# Patient Record
Sex: Female | Born: 1965 | Race: White | Hispanic: No | Smoking: Current every day smoker
Health system: Southern US, Community
[De-identification: ages and names within clinical notes are randomized; demographics above are authoritative.]

## PROBLEM LIST (undated history)

## (undated) DIAGNOSIS — E785 Hyperlipidemia, unspecified: Secondary | ICD-10-CM

## (undated) DIAGNOSIS — F17200 Nicotine dependence, unspecified, uncomplicated: Secondary | ICD-10-CM

## (undated) DIAGNOSIS — J449 Chronic obstructive pulmonary disease, unspecified: Secondary | ICD-10-CM

## (undated) HISTORY — DX: Nicotine dependence, unspecified, uncomplicated: F17.200

## (undated) HISTORY — PX: ABDOMINAL HYSTERECTOMY: SHX81

## (undated) HISTORY — DX: Hyperlipidemia, unspecified: E78.5

---

## 2006-12-06 ENCOUNTER — Encounter: Admission: RE | Admit: 2006-12-06 | Discharge: 2006-12-06 | Payer: Self-pay | Admitting: Neurosurgery

## 2010-04-06 LAB — HM PAP SMEAR: HM Pap smear: NORMAL

## 2010-04-13 ENCOUNTER — Encounter: Admission: RE | Admit: 2010-04-13 | Discharge: 2010-04-13 | Payer: Self-pay | Admitting: Family Medicine

## 2010-11-05 ENCOUNTER — Emergency Department (HOSPITAL_COMMUNITY)
Admission: EM | Admit: 2010-11-05 | Discharge: 2010-11-05 | Disposition: A | Discharge status: Home / Self Care | Payer: Medicaid Other | Attending: Emergency Medicine | Admitting: Emergency Medicine

## 2010-11-05 ENCOUNTER — Emergency Department (HOSPITAL_COMMUNITY): Payer: Medicaid Other

## 2010-11-05 DIAGNOSIS — M79609 Pain in unspecified limb: Secondary | ICD-10-CM | POA: Insufficient documentation

## 2010-11-05 DIAGNOSIS — M542 Cervicalgia: Secondary | ICD-10-CM | POA: Insufficient documentation

## 2010-11-05 DIAGNOSIS — R079 Chest pain, unspecified: Secondary | ICD-10-CM | POA: Insufficient documentation

## 2010-11-05 DIAGNOSIS — R1013 Epigastric pain: Secondary | ICD-10-CM | POA: Insufficient documentation

## 2010-11-05 DIAGNOSIS — Z79899 Other long term (current) drug therapy: Secondary | ICD-10-CM | POA: Insufficient documentation

## 2010-11-05 DIAGNOSIS — E7800 Pure hypercholesterolemia, unspecified: Secondary | ICD-10-CM | POA: Insufficient documentation

## 2010-11-05 LAB — BASIC METABOLIC PANEL
CO2: 27 mEq/L (ref 19–32)
Calcium: 9.2 mg/dL (ref 8.4–10.5)
Creatinine, Ser: 0.68 mg/dL (ref 0.4–1.2)
GFR calc Af Amer: >60 mL/min (ref >60)
GFR calc non Af Amer: >60 mL/min (ref >60)

## 2010-11-05 LAB — CBC
Hemoglobin: 14.4 g/dL (ref 12.0–15.0)
MCH: 32.4 pg (ref 26.0–34.0)
Platelets: 252 10*3/uL (ref 150–400)

## 2010-11-05 LAB — DIFFERENTIAL
Basophils Relative: 0 % (ref 0–1)
Eosinophils Absolute: 0.3 10*3/uL (ref 0.0–0.7)
Monocytes Absolute: 0.4 10*3/uL (ref 0.1–1.0)
Neutrophils Relative %: 49 % (ref 43–77)

## 2010-11-05 LAB — POCT CARDIAC MARKERS
CKMB, poc: <1 ng/mL — ABNORMAL LOW (ref 1.0–8.0)
Troponin i, poc: <0.05 ng/mL (ref 0.00–0.09)
Troponin i, poc: <0.05 ng/mL (ref 0.00–0.09)

## 2011-02-03 ENCOUNTER — Encounter: Payer: Self-pay | Admitting: Family Medicine

## 2011-04-25 ENCOUNTER — Other Ambulatory Visit: Payer: Self-pay | Admitting: Family Medicine

## 2011-04-25 DIAGNOSIS — Z1231 Encounter for screening mammogram for malignant neoplasm of breast: Secondary | ICD-10-CM

## 2011-04-29 ENCOUNTER — Ambulatory Visit
Admission: RE | Admit: 2011-04-29 | Discharge: 2011-04-29 | Disposition: A | Discharge status: Home / Self Care | Payer: Medicaid Other | Source: Ambulatory Visit | Attending: Family Medicine | Admitting: Family Medicine

## 2011-04-29 DIAGNOSIS — Z1231 Encounter for screening mammogram for malignant neoplasm of breast: Secondary | ICD-10-CM

## 2012-05-04 ENCOUNTER — Other Ambulatory Visit: Payer: Self-pay | Admitting: Family Medicine

## 2012-05-04 DIAGNOSIS — Z1231 Encounter for screening mammogram for malignant neoplasm of breast: Secondary | ICD-10-CM

## 2012-06-01 ENCOUNTER — Ambulatory Visit
Admission: RE | Admit: 2012-06-01 | Discharge: 2012-06-01 | Disposition: A | Discharge status: Home / Self Care | Payer: Medicaid Other | Source: Ambulatory Visit | Attending: Family Medicine | Admitting: Family Medicine

## 2012-06-01 DIAGNOSIS — Z1231 Encounter for screening mammogram for malignant neoplasm of breast: Secondary | ICD-10-CM

## 2012-11-12 ENCOUNTER — Other Ambulatory Visit (INDEPENDENT_AMBULATORY_CARE_PROVIDER_SITE_OTHER): Payer: Medicaid Other

## 2012-11-12 ENCOUNTER — Telehealth: Payer: Self-pay | Admitting: Family Medicine

## 2012-11-12 DIAGNOSIS — Z79899 Other long term (current) drug therapy: Secondary | ICD-10-CM

## 2012-11-12 DIAGNOSIS — E785 Hyperlipidemia, unspecified: Secondary | ICD-10-CM

## 2012-11-12 LAB — COMPREHENSIVE METABOLIC PANEL
Albumin: 4.3 g/dL (ref 3.5–5.2)
Alkaline Phosphatase: 68 U/L (ref 39–117)
CO2: 30 mEq/L (ref 19–32)
Calcium: 9.7 mg/dL (ref 8.4–10.5)
Chloride: 106 mEq/L (ref 96–112)
Creat: 0.63 mg/dL (ref 0.50–1.10)
Glucose, Bld: 113 mg/dL — ABNORMAL HIGH (ref 70–99)
Sodium: 141 mEq/L (ref 135–145)
Total Protein: 7.1 g/dL (ref 6.0–8.3)

## 2012-11-12 LAB — CBC WITH DIFFERENTIAL/PLATELET
Eosinophils Absolute: 0.4 10*3/uL (ref 0.0–0.7)
Eosinophils Relative: 4 % (ref 0–5)
HCT: 43.2 % (ref 36.0–46.0)
Lymphs Abs: 2.5 10*3/uL (ref 0.7–4.0)
MCH: 32.7 pg (ref 26.0–34.0)
Monocytes Relative: 6 % (ref 3–12)
Neutrophils Relative %: 61 % (ref 43–77)
Platelets: 241 10*3/uL (ref 150–400)
RBC: 4.59 MIL/uL (ref 3.87–5.11)
RDW: 14.1 % (ref 11.5–15.5)
WBC: 8.5 10*3/uL (ref 4.0–10.5)

## 2012-11-12 LAB — LIPID PANEL
Cholesterol: 128 mg/dL (ref 0–200)
LDL Cholesterol: 75 mg/dL (ref 0–99)

## 2012-11-12 MED ORDER — ROSUVASTATIN CALCIUM 20 MG PO TABS: 20.0000 mg | ORAL_TABLET | Freq: Every day | ORAL | Status: DC

## 2012-11-12 NOTE — Telephone Encounter (Signed)
NEED MEDICATION

## 2012-11-12 NOTE — Telephone Encounter (Signed)
RX Refilled 

## 2012-12-10 ENCOUNTER — Encounter: Payer: Self-pay | Admitting: Family Medicine

## 2012-12-10 ENCOUNTER — Ambulatory Visit (INDEPENDENT_AMBULATORY_CARE_PROVIDER_SITE_OTHER): Payer: Medicaid Other | Admitting: Family Medicine

## 2012-12-10 VITALS — BP 100/60 | HR 80 | Temp 98.1°F | Resp 14 | Wt 153.0 lb

## 2012-12-10 DIAGNOSIS — F17200 Nicotine dependence, unspecified, uncomplicated: Secondary | ICD-10-CM

## 2012-12-10 DIAGNOSIS — R079 Chest pain, unspecified: Secondary | ICD-10-CM

## 2012-12-10 DIAGNOSIS — Z0000 Encounter for general adult medical examination without abnormal findings: Secondary | ICD-10-CM

## 2012-12-10 DIAGNOSIS — E785 Hyperlipidemia, unspecified: Secondary | ICD-10-CM

## 2012-12-10 NOTE — Progress Notes (Signed)
Subjective:    Patient ID: Michelle Clayton, female    DOB: 11-28-1965, 47 y.o.   MRN: [id number]  HPI Patient is here for complete physical exam.  She continues to smoke.  Her brother has recently had a massive heart attack at age 59. Furthermore the patient reports that she is having episodic chest pain. This seems to be related to stress and anxiety and has the characteristics of a panic attack. She denies any angina, shortness of breath, or dyspnea on exertion. The pain is substernal and pressure-like in intensity but it does not radiate. Again it occurs when she is anxious.  She has a strong family history of coronary artery disease.  Past Medical History  Diagnosis Date  . Hyperlipidemia   . Current smoker    Current outpatient prescriptions:aspirin 81 MG tablet, Take 81 mg by mouth daily., Disp: , Rfl: ;  niacin (NIASPAN) 500 MG CR tablet, 500 mg. 3 tab po qhs, Disp: , Rfl: ;  rosuvastatin (CRESTOR) 20 MG tablet, Take 1 tablet (20 mg total) by mouth at bedtime., Disp: 30 tablet, Rfl: 3  No Known Allergies   History   Social History  . Marital Status: Legally Separated    Spouse Name: N/A    Number of Children: N/A  . Years of Education: N/A   Occupational History  . Not on file.   Social History Main Topics  . Smoking status: Current Every Day Smoker -- 1.00 packs/day    Types: Cigarettes  . Smokeless tobacco: Not on file  . Alcohol Use: No  . Drug Use: No  . 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 Not on file   Social History Narrative  . No narrative on file   Family History  Problem Relation Age of Onset  . COPD Mother   . Cancer Father     lung, liver, brain primary unknown  . Heart disease Brother 7    MI  . Cancer Paternal Grandfather     colon     Review of Systems  All other systems reviewed and are negative.       Objective:   Physical Exam  Constitutional:  She is oriented to person, place, and time. She appears well-developed and well-nourished.  HENT:  Head: Normocephalic.  Right Ear: External ear normal.  Left Ear: External ear normal.  Nose: Nose normal.  Mouth/Throat: Oropharynx is clear and moist. No oropharyngeal exudate.  Eyes: Conjunctivae and EOM are normal. Pupils are equal, round, and reactive to light. Right eye exhibits no discharge. Left eye exhibits no discharge. No scleral icterus.  Neck: Normal range of motion. Neck supple. No JVD present. No tracheal deviation present. No thyromegaly present.  Cardiovascular: Normal rate, regular rhythm and normal heart sounds.  Exam reveals no gallop and no friction rub.   No murmur heard. Pulmonary/Chest: Effort normal and breath sounds normal. No stridor. No respiratory distress. She has no wheezes. She has no rales. She exhibits no tenderness.  Abdominal: Soft. Bowel sounds are normal. She exhibits no distension and no mass. There is no tenderness. There is no rebound and no guarding.  Musculoskeletal: Normal range of motion. She exhibits no edema and no tenderness.  Lymphadenopathy:    She has no cervical adenopathy.  Neurological: She is alert and oriented to person, place, and time. She has normal reflexes. She displays normal reflexes. No  cranial nerve deficit. She exhibits normal muscle tone. Coordination normal.  Skin: Skin is warm and dry. No rash noted. No erythema. No pallor.  Psychiatric: She has a normal mood and affect. Her behavior is normal. Judgment and thought content normal.   patient's breasts are normal.        Assessment & Plan:  1. Routine general medical examination at a health care facility Patient had a mammogram last in October. She's not due until this October. Strongly encourage smoking cessation.    2. Smoker Strongly encourage smoking cessation - DG Chest 2 View; Future  3. HLD (hyperlipidemia) I reviewed the patient's lab work. It was significant for  a glucose of 113. However her fasting lipid panel was outstanding. We will continue the Crestor. I spent 10 minutes discussing therapeutic lifestyle changes to address her hyperglycemia. These include diet exercise and weight loss.  Recheck labs in 6 months.  4. Chest pain Given her smoking history, I will obtain a chest x-ray to rule out pulmonic pathology. I feel the patient's pain is most likely panic attacks.  However the patient like to schedule a stress test given her strong family history coronary artery disease, smoking, her hyperlipidemia. I feel that this is appropriate and will arrange a nonurgent outpatient stress test. - DG Chest 2 View; Future - Ambulatory referral to Cardiology

## 2012-12-25 ENCOUNTER — Other Ambulatory Visit: Payer: Self-pay | Admitting: Family Medicine

## 2012-12-25 MED ORDER — ROSUVASTATIN CALCIUM 20 MG PO TABS: 20.0000 mg | ORAL_TABLET | Freq: Every day | ORAL | Status: DC

## 2012-12-25 NOTE — Telephone Encounter (Signed)
Med rf °

## 2012-12-26 ENCOUNTER — Encounter: Payer: Self-pay | Admitting: Family Medicine

## 2012-12-26 NOTE — Telephone Encounter (Signed)
Error pleae disregbard

## 2012-12-26 NOTE — Progress Notes (Signed)
Patient ID: Michelle Clayton, female   DOB: June 21, 1966, 47 y.o.   MRN: [id number] Pt states she called pharmacy to see if her crestor was ready and they told her they were waiting on our approval. I checked her chart and it shows that we had sent it prescription on the 13 May. Pt is going to call pharmacy again and let them know and if they still dont have it then she will call me back

## 2013-01-04 ENCOUNTER — Encounter: Payer: Self-pay | Admitting: Cardiovascular Disease

## 2013-01-04 ENCOUNTER — Ambulatory Visit (INDEPENDENT_AMBULATORY_CARE_PROVIDER_SITE_OTHER): Payer: Medicaid Other | Admitting: Cardiovascular Disease

## 2013-01-04 ENCOUNTER — Ambulatory Visit (INDEPENDENT_AMBULATORY_CARE_PROVIDER_SITE_OTHER)
Admission: RE | Admit: 2013-01-04 | Discharge: 2013-01-04 | Disposition: A | Discharge status: Home / Self Care | Payer: Medicaid Other | Source: Ambulatory Visit | Attending: Cardiovascular Disease | Admitting: Cardiovascular Disease

## 2013-01-04 VITALS — BP 128/70 | HR 73 | Ht 63.0 in | Wt 153.0 lb

## 2013-01-04 DIAGNOSIS — R0989 Other specified symptoms and signs involving the circulatory and respiratory systems: Secondary | ICD-10-CM

## 2013-01-04 DIAGNOSIS — R079 Chest pain, unspecified: Secondary | ICD-10-CM

## 2013-01-04 DIAGNOSIS — R0600 Dyspnea, unspecified: Secondary | ICD-10-CM

## 2013-01-04 DIAGNOSIS — E785 Hyperlipidemia, unspecified: Secondary | ICD-10-CM

## 2013-01-04 DIAGNOSIS — F17200 Nicotine dependence, unspecified, uncomplicated: Secondary | ICD-10-CM

## 2013-01-04 NOTE — Assessment & Plan Note (Signed)
Counseled for less than 10 minutes not motivated to quit

## 2013-01-04 NOTE — Progress Notes (Signed)
Patient ID: Michelle Clayton, female   DOB: 02/02/1966, 47 y.o.   MRN: [id number] Patient is here for evaluation of chest pain  She continues to smoke. Her brother has recently had a massive heart attack at age 31. Furthermore the patient reports that she is having episodic chest pain. This seems to be related to stress and anxiety and has the characteristics of a panic attack. She found out her dad had metastatic cancer at same time  She denies any angina, shortness of breath, or dyspnea on exertion. The pain is substernal and pressure-like in intensity but it does not radiate. Again it occurs when she is anxious. She has a strong family history of coronary artery disease.  She smokes ppd.  Has not had a CXR in long time. Tried Chantix before but couldn't tolertate " made me mean and craszy"   ROS: Denies fever, malais, weight loss, blurry vision, decreased visual acuity, cough, sputum, SOB, hemoptysis, pleuritic pain, palpitaitons, heartburn, abdominal pain, melena, lower extremity edema, claudication, or rash.  All other systems reviewed and negative   General: Affect appropriate Healthy:  appears stated age HEENT: normal Neck supple with no adenopathy JVP normal no bruits no thyromegaly Lungs clear with no wheezing and good diaphragmatic motion Heart:  S1/S2 no murmur,rub, gallop or click PMI normal Abdomen: benighn, BS positve, no tenderness, no AAA no bruit.  No HSM or HJR Distal pulses intact with no bruits No edema Neuro non-focal Skin warm and dry No muscular weakness  Medications Current Outpatient Prescriptions  Medication Sig Dispense Refill  . aspirin 81 MG tablet Take 81 mg by mouth daily.      . niacin (NIASPAN) 500 MG CR tablet 500 mg. 3 tab po qhs      . rosuvastatin (CRESTOR) 20 MG tablet Take 1 tablet (20 mg total) by mouth at bedtime.  30 tablet  3   No current facility-administered medications for this visit.    Allergies Review of patient's allergies indicates  no known allergies.  Family History: Family History  Problem Relation Age of Onset  . COPD Mother   . Cancer Father     lung, liver, brain primary unknown  . Heart disease Brother 71    MI  . Cancer Paternal Grandfather     colon    Social History: History   Social History  . Marital Status: Legally Separated    Spouse Name: N/A    Number of Children: N/A  . Years of Education: N/A   Occupational History  . Not on file.   Social History Main Topics  . Smoking status: Current Every Day Smoker -- 1.00 packs/day    Types: Cigarettes  . Smokeless tobacco: Not on file  . Alcohol Use: No  . Drug Use: No  . 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 Not on file   Social History Narrative  . No narrative on file    Electrocardiogram:  NSR normal ECG rate 73  Assessment and Plan

## 2013-01-04 NOTE — Assessment & Plan Note (Signed)
Atypical Agree likely stress related. Normal ECG  F/U ETT

## 2013-01-04 NOTE — Patient Instructions (Signed)
Your physician recommends that you schedule a follow-up appointment in: AS NEEDED  Your physician recommends that you continue on your current medications as directed. Please refer to the Current Medication list given to you today. A chest x-ray takes a picture of the organs and structures inside the chest, including the heart, lungs, and blood vessels. This test can show several things, including, whether the heart is enlarges; whether fluid is building up in the lungs; and whether pacemaker / defibrillator leads are still in place. Your physician has requested that you have an exercise tolerance test. For further information please visit [email or web address]. Please also follow instruction sheet, as given.  

## 2013-01-04 NOTE — Assessment & Plan Note (Signed)
Cholesterol is at goal.  Continue current dose of statin and diet Rx.  No myalgias or side effects.  F/U  LFT's in 6 months. Lab Results  Component Value Date   LDLCALC 75 11/12/2012

## 2013-01-28 ENCOUNTER — Ambulatory Visit (INDEPENDENT_AMBULATORY_CARE_PROVIDER_SITE_OTHER): Payer: Medicaid Other | Admitting: Nurse Practitioner

## 2013-01-28 ENCOUNTER — Ambulatory Visit (INDEPENDENT_AMBULATORY_CARE_PROVIDER_SITE_OTHER): Payer: Medicaid Other | Admitting: Family Medicine

## 2013-01-28 ENCOUNTER — Encounter: Payer: Self-pay | Admitting: Nurse Practitioner

## 2013-01-28 ENCOUNTER — Encounter: Payer: Self-pay | Admitting: Family Medicine

## 2013-01-28 VITALS — BP 106/68 | HR 82 | Temp 98.7°F | Resp 16 | Wt 149.0 lb

## 2013-01-28 VITALS — BP 109/70 | HR 80

## 2013-01-28 DIAGNOSIS — R079 Chest pain, unspecified: Secondary | ICD-10-CM

## 2013-01-28 DIAGNOSIS — B029 Zoster without complications: Secondary | ICD-10-CM

## 2013-01-28 MED ORDER — VALACYCLOVIR HCL 1 G PO TABS: 1000.0000 mg | ORAL_TABLET | Freq: Three times a day (TID) | ORAL | Status: DC

## 2013-01-28 MED ORDER — HYDROCODONE-ACETAMINOPHEN 10-325 MG PO TABS: 1.0000 | ORAL_TABLET | Freq: Three times a day (TID) | ORAL | Status: DC | PRN

## 2013-01-28 NOTE — Progress Notes (Signed)
  Subjective:    Patient ID: Michelle Clayton, female    DOB: 04-08-1966, 47 y.o.   MRN: [id number]  HPI Patient reports a two-day history of burning stinging pain slightly inferior to her right buttock.  There is a red, papular, vesicular rash in a cluster in that area.  Is herpetiform in nature. The surrounding skin feels like it's on fire. Past Medical History  Diagnosis Date  . Hyperlipidemia   . Current smoker    Current Outpatient Prescriptions on File Prior to Visit  Medication Sig Dispense Refill  . aspirin 81 MG tablet Take 81 mg by mouth daily.      . niacin (NIASPAN) 500 MG CR tablet 500 mg. 3 tab po qhs      . rosuvastatin (CRESTOR) 20 MG tablet Take 1 tablet (20 mg total) by mouth at bedtime.  30 tablet  3   No current facility-administered medications on file prior to visit.   No Known Allergies History   Social History  . Marital Status: Legally Separated    Spouse Name: N/A    Number of Children: N/A  . Years of Education: N/A   Occupational History  . Not on file.   Social History Main Topics  . Smoking status: Current Every Day Smoker -- 1.00 packs/day    Types: Cigarettes  . Smokeless tobacco: Not on file  . Alcohol Use: No  . Drug Use: No  . 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 Not on file   Social History Narrative  . No narrative on file      Review of Systems  All other systems reviewed and are negative.       Objective:   Physical Exam  Vitals reviewed. Cardiovascular: Normal rate and regular rhythm.   Pulmonary/Chest: Effort normal and breath sounds normal.  Skin: Rash noted.   5 cm cluster of erythematous papules and vesicles just inferior to the right gluteus maximus. The herpetiform rash is most consistent with shingles.        Assessment & Plan:  1. Shingles Valtrex 1 g by mouth 3 times a day for 7 days. Norco 10/325 one by mouth every 8  hours when necessary severe pain. I advised the patient to break the pills in half and take half a pill as needed. She was given 20 tablets.

## 2013-01-28 NOTE — Progress Notes (Signed)
Exercise Treadmill Test  Pre-Exercise Testing Evaluation Rhythm: normal sinus  Rate: 80     Test   Exercise Tolerance Test Ordering MD: Charlton Haws, MD  Interpreting MD: Norma Fredrickson, NP  Unique Test No: 1  Treadmill:  1  Indication for ETT: chest pain - rule out ischemia  Contraindication to ETT: No   Stress Modality: exercise - treadmill  Cardiac Imaging Performed: non   Protocol: standard Bruce - maximal  Max BP:  143/81  Max MPHR (bpm):  173 85% MPR (bpm):  147  MPHR obtained (bpm):  148 % MPHR obtained:  86%  Reached 85% MPHR (min:sec):  7:52 Total Exercise Time (min-sec):  9:00  Workload in METS:  9 Borg Scale: 19  Reason ETT Terminated:  patient's desire to stop    ST Segment Analysis At Rest: normal ST segments - no evidence of significant ST depression With Exercise: no evidence of significant ST depression  Other Information Arrhythmia:  No Angina during ETT:  absent (0) Quality of ETT:  diagnostic  ETT Interpretation:  normal - no evidence of ischemia by ST analysis  Comments: Patient presents today for routine GXT. Has had chest pain in the setting of significant situational stress (brother with massive MI and father with metastatic cancer - all found within an hour's time). Has ongoing tobacco abuse and HLD on therapy.   Today, she exercised on the standard Bruce protocol for a total of 9 minutes. Good exercise tolerance. Adequate BP response. Clinically negative for chest pain. EKG negative for ischemia. No arrhythmia noted.   Recommendations: CV risk factor modification.  Smoking cessation  See back prn.   Patient is agreeable to this plan and will call if any problems develop in the interim.   Rosalio Macadamia, RN, ANP-C [facility] [address] [place], [place]  [address]

## 2013-02-28 ENCOUNTER — Telehealth: Payer: Self-pay | Admitting: Family Medicine

## 2013-02-28 MED ORDER — NIACIN ER (ANTIHYPERLIPIDEMIC) 500 MG PO TBCR: 500.0000 mg | EXTENDED_RELEASE_TABLET | Freq: Every day | ORAL | Status: DC

## 2013-02-28 NOTE — Telephone Encounter (Signed)
Med refijlled °

## 2013-03-04 ENCOUNTER — Telehealth: Payer: Self-pay | Admitting: Family Medicine

## 2013-03-04 MED ORDER — NIACIN ER (ANTIHYPERLIPIDEMIC) 500 MG PO TBCR: 500.0000 mg | EXTENDED_RELEASE_TABLET | Freq: Every day | ORAL | Status: DC

## 2013-03-04 NOTE — Telephone Encounter (Signed)
Sent correct rx number of tabs

## 2013-03-11 ENCOUNTER — Telehealth: Payer: Self-pay | Admitting: Family Medicine

## 2013-03-13 NOTE — Telephone Encounter (Signed)
PA done and faxed 

## 2013-04-18 ENCOUNTER — Encounter: Payer: Self-pay | Admitting: Family Medicine

## 2013-04-29 ENCOUNTER — Other Ambulatory Visit: Payer: Self-pay | Admitting: Family Medicine

## 2013-04-30 NOTE — Telephone Encounter (Signed)
Meds refilled.

## 2013-06-20 ENCOUNTER — Other Ambulatory Visit: Payer: Self-pay

## 2013-09-06 ENCOUNTER — Encounter: Payer: Self-pay | Admitting: Family Medicine

## 2013-09-06 ENCOUNTER — Ambulatory Visit (INDEPENDENT_AMBULATORY_CARE_PROVIDER_SITE_OTHER): Payer: Medicaid Other | Admitting: Family Medicine

## 2013-09-06 VITALS — BP 110/68 | HR 68 | Temp 97.9°F | Resp 14 | Ht 63.0 in | Wt 151.0 lb

## 2013-09-06 DIAGNOSIS — Z23 Encounter for immunization: Secondary | ICD-10-CM

## 2013-09-06 DIAGNOSIS — N951 Menopausal and female climacteric states: Secondary | ICD-10-CM

## 2013-09-06 DIAGNOSIS — E785 Hyperlipidemia, unspecified: Secondary | ICD-10-CM

## 2013-09-06 MED ORDER — ESTRADIOL 0.025 MG/24HR TD PTTW: 1.0000 | MEDICATED_PATCH | TRANSDERMAL | Status: DC

## 2013-09-06 NOTE — Addendum Note (Signed)
Addended by: Legrand RamsWILLIS, Ornella Coderre B on: 09/06/2013 04:27 PM   Modules accepted: Orders

## 2013-09-06 NOTE — Progress Notes (Signed)
   Subjective:    Patient ID: Michelle LabellaSuellen L Clayton, female    DOB: [birth year], 48 y.o.   MRN: [id number]  HPI Patient is experiencing severe perimenopausal hot flashes. They're debilitating. They're keeping her awake at night. She's constantly sweating. She is also experienced many changes related to the change in her hormone levels. However, her medical history is complicated by the fact that she smokes and she has severe hyperlipidemia. We had a long discussion today in the office about the risk of hormone replacement therapy including stroke, DVT, and breast cancer. Fortunately the patient has had a hysterectomy. Therefore she could take estrogen alone. Therefore, her main risk would be stroke and venous thromboembolic events .  Past Medical History  Diagnosis Date  . Hyperlipidemia   . Current smoker    Current Outpatient Prescriptions on File Prior to Visit  Medication Sig Dispense Refill  . aspirin 81 MG tablet Take 81 mg by mouth daily.      . CRESTOR 20 MG tablet TAKE 1 TABLET (20 MG TOTAL) BY MOUTH AT BEDTIME.  30 tablet  3   No current facility-administered medications on file prior to visit.   No Known Allergies History   Social History  . Marital Status: Legally Separated    Spouse Name: N/A    Number of Children: N/A  . Years of Education: N/A   Occupational History  . Not on file.   Social History Main Topics  . Smoking status: Current Every Day Smoker -- 1.00 packs/day    Types: Cigarettes  . Smokeless tobacco: Not on file  . Alcohol Use: No  . Drug Use: No  . 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 Not on file   Social History Narrative  . No narrative on file       Review of Systems  All other systems reviewed and are negative.       Objective:   Physical Exam  Vitals reviewed. Cardiovascular: Normal rate, regular rhythm and normal heart sounds.  Exam reveals no  friction rub.   No murmur heard. Pulmonary/Chest: Effort normal and breath sounds normal. She has no wheezes. She has no rales. She exhibits no tenderness.  Musculoskeletal: She exhibits no edema.          Assessment & Plan:  1. Hot flashes, menopausal I explained to the patient and her risk of stroke and venous thromboembolic events would be increased by taking estrogen. The patient is fully aware of the risk and wants to continue with hormone replacement therapy due to the severity of her symptoms. therefore I will prescribe estradiol patches 0.025 mg per 24-hour.  I strongly recommended smoking cessation to decrease the risk of strokes and thromboembolic event.  - estradiol (VIVELLE-DOT) 0.025 MG/24HR; Place 1 patch onto the skin 2 (two) times a week.  Dispense: 8 patch; Refill: 12

## 2013-09-07 ENCOUNTER — Encounter: Payer: Self-pay | Admitting: Family Medicine

## 2013-09-09 ENCOUNTER — Other Ambulatory Visit: Payer: Self-pay | Admitting: Family Medicine

## 2013-09-09 DIAGNOSIS — N951 Menopausal and female climacteric states: Secondary | ICD-10-CM

## 2013-09-09 MED ORDER — ESTRADIOL 0.025 MG/24HR TD PTTW: 1.0000 | MEDICATED_PATCH | TRANSDERMAL | Status: DC

## 2013-09-25 ENCOUNTER — Encounter (HOSPITAL_COMMUNITY): Payer: Self-pay | Admitting: Emergency Medicine

## 2013-09-25 ENCOUNTER — Emergency Department (HOSPITAL_COMMUNITY)
Admission: EM | Admit: 2013-09-25 | Discharge: 2013-09-25 | Disposition: A | Discharge status: Home / Self Care | Payer: Medicaid Other | Attending: Emergency Medicine | Admitting: Emergency Medicine

## 2013-09-25 DIAGNOSIS — Z7982 Long term (current) use of aspirin: Secondary | ICD-10-CM | POA: Insufficient documentation

## 2013-09-25 DIAGNOSIS — F17200 Nicotine dependence, unspecified, uncomplicated: Secondary | ICD-10-CM | POA: Insufficient documentation

## 2013-09-25 DIAGNOSIS — Z79899 Other long term (current) drug therapy: Secondary | ICD-10-CM | POA: Insufficient documentation

## 2013-09-25 DIAGNOSIS — M654 Radial styloid tenosynovitis [de Quervain]: Secondary | ICD-10-CM | POA: Insufficient documentation

## 2013-09-25 DIAGNOSIS — E785 Hyperlipidemia, unspecified: Secondary | ICD-10-CM | POA: Insufficient documentation

## 2013-09-25 MED ORDER — HYDROCODONE-ACETAMINOPHEN 5-325 MG PO TABS: 1.0000 | ORAL_TABLET | ORAL | Status: DC | PRN

## 2013-09-25 NOTE — ED Provider Notes (Signed)
Medical screening examination/treatment/procedure(s) were performed by non-physician practitioner and as supervising physician I was immediately available for consultation/collaboration.  EKG Interpretation   None         Allaina Brotzman M Tahir Blank, DO 09/25/13 1944 

## 2013-09-25 NOTE — ED Notes (Signed)
Pt c/o left thumb pain x 3 days; pt denies obvious injury and sts increased pain with movement

## 2013-09-25 NOTE — Discharge Instructions (Signed)
Take the prescribed medication as directed.  Wear wrist splint to help stabilize wrist. Follow-up with your primary care physician if symptoms not improving in the next few days. Return to the ED for new or worsening symptoms.

## 2013-09-25 NOTE — ED Provider Notes (Signed)
CSN: [id number]     Arrival date & time 09/25/13  1042 History  This chart was scribed for non-physician practitioner, Sharilyn Sites, PA-C working with Laray Anger, DO by Greggory Stallion, ED scribe. This patient was seen in room TR08C/TR08C and the patient's care was started at 11:21 AM.   Chief Complaint  Patient presents with  . Hand Pain   The history is provided by the patient. No language interpreter was used.   HPI Comments: Michelle Clayton is a 48 y.o. female who presents to the Emergency Department complaining of gradual onset, constant left thumb and wrist pain that started 3 days ago. 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 Pain is worse with movement. She has no pain in her other fingers. Denies previous injury to left wrist or hand.  Pt is left hand dominant.  Has taken OTC meds without noted improvement.  VS stable on arrival.  Past Medical History  Diagnosis Date  . Hyperlipidemia   . Current smoker    Past Surgical History  Procedure Laterality Date  . Abdominal hysterectomy     Family History  Problem Relation Age of Onset  . COPD Mother   . Cancer Father     lung, liver, brain primary unknown  . Heart disease Brother 47    MI  . Cancer Paternal Grandfather     colon   History  Substance Use Topics  . Smoking status: Current Every Day Smoker -- 1.00 packs/day    Types: Cigarettes  . Smokeless tobacco: Not on file  . Alcohol Use: No   OB History   Grav Para Term Preterm Abortions TAB SAB Ect Mult Living                 Review of Systems  Musculoskeletal: Positive for arthralgias.  All other systems reviewed and are negative.   Allergies  Review of patient's allergies indicates no known allergies.  Home Medications   Current Outpatient Rx  Name  Route  Sig  Dispense  Refill  . aspirin 81 MG tablet   Oral   Take 81 mg by mouth daily.         . CRESTOR 20 MG tablet      TAKE 1 TABLET (20 MG TOTAL) BY MOUTH AT BEDTIME.    30 tablet   3   . estradiol (VIVELLE-DOT) 0.025 MG/24HR   Transdermal   Place 1 patch onto the skin 2 (two) times a week.   8 patch   12     PLEASE SNED AS NAME BRAND ONLY - TRIED CALLING SEV ...    BP 115/70  Pulse 77  Temp(Src) 97.2 F (36.2 C) (Oral)  Resp 18  Ht 5\' 3"  (1.6 m)  Wt 161 lb (73.029 kg)  BMI 28.53 kg/m2  SpO2 98%  Physical Exam  Nursing note and vitals reviewed. Constitutional: She is oriented to person, place, and time. She appears well-developed and well-nourished. No distress.  HENT:  Head: Normocephalic and atraumatic.  Mouth/Throat: Oropharynx is clear and moist.  Eyes: Conjunctivae and EOM are normal. Pupils are equal, round, and reactive to light.  Neck: Normal range of motion.  Cardiovascular: Normal rate, regular rhythm and normal heart sounds.   Pulmonary/Chest: Effort normal and breath sounds normal. No respiratory distress. She has no wheezes.  Musculoskeletal:       Left wrist: She exhibits decreased range of motion and tenderness. She exhibits no bony tenderness, no swelling,  no effusion, no crepitus, no deformity and no laceration.       Arms: Tenderness to palpation along left thenar eminence. Limited ROM of thumb due to pain. Positive Finkelstein's test. Strong radial pulse. Capillary refill less than 3 seconds. Sensation intact. Moving all other fingers appropriately.   Neurological: She is alert and oriented to person, place, and time.  Skin: Skin is warm and dry. She is not diaphoretic.  Psychiatric: She has a normal mood and affect.    ED Course  Procedures (including critical care time)  DIAGNOSTIC STUDIES: Oxygen Saturation is 98% on RA, normal by my interpretation.    COORDINATION OF CARE: 11:24 AM-Discussed treatment plan which includes a brace and pain medication with pt at bedside and pt agreed to plan.   Labs Review Labs Reviewed - No data to display Imaging Review No results found.  EKG Interpretation   None        MDM   Final diagnoses:  De Quervain's tenosynovitis, left   Atraumatic left wrist pain with positive Finkelstein's test and no signs of trauma or deformity. Will treat as de Quervain's tendinitis. Wrist placed in thumb spica splint. Short supply of Norco. Patient will followup with her PCP if no improvement within the next week. Discussed with the patient, she agreed. Return precautions advised.  I personally performed the services described in this documentation, which was scribed in my presence. The recorded information has been reviewed and is accurate.   Garlon HatchetLisa M Sanders, PA-C 09/25/13 1431

## 2013-09-30 IMAGING — CR DG CHEST 2V
2 series · 2 of 2 positions shown · non-contrast
Comparison: 11/05/2010

CLINICAL DATA: Cough.  Smoker.

CHEST - 2 VIEW

[view not recorded (1 of 2)]
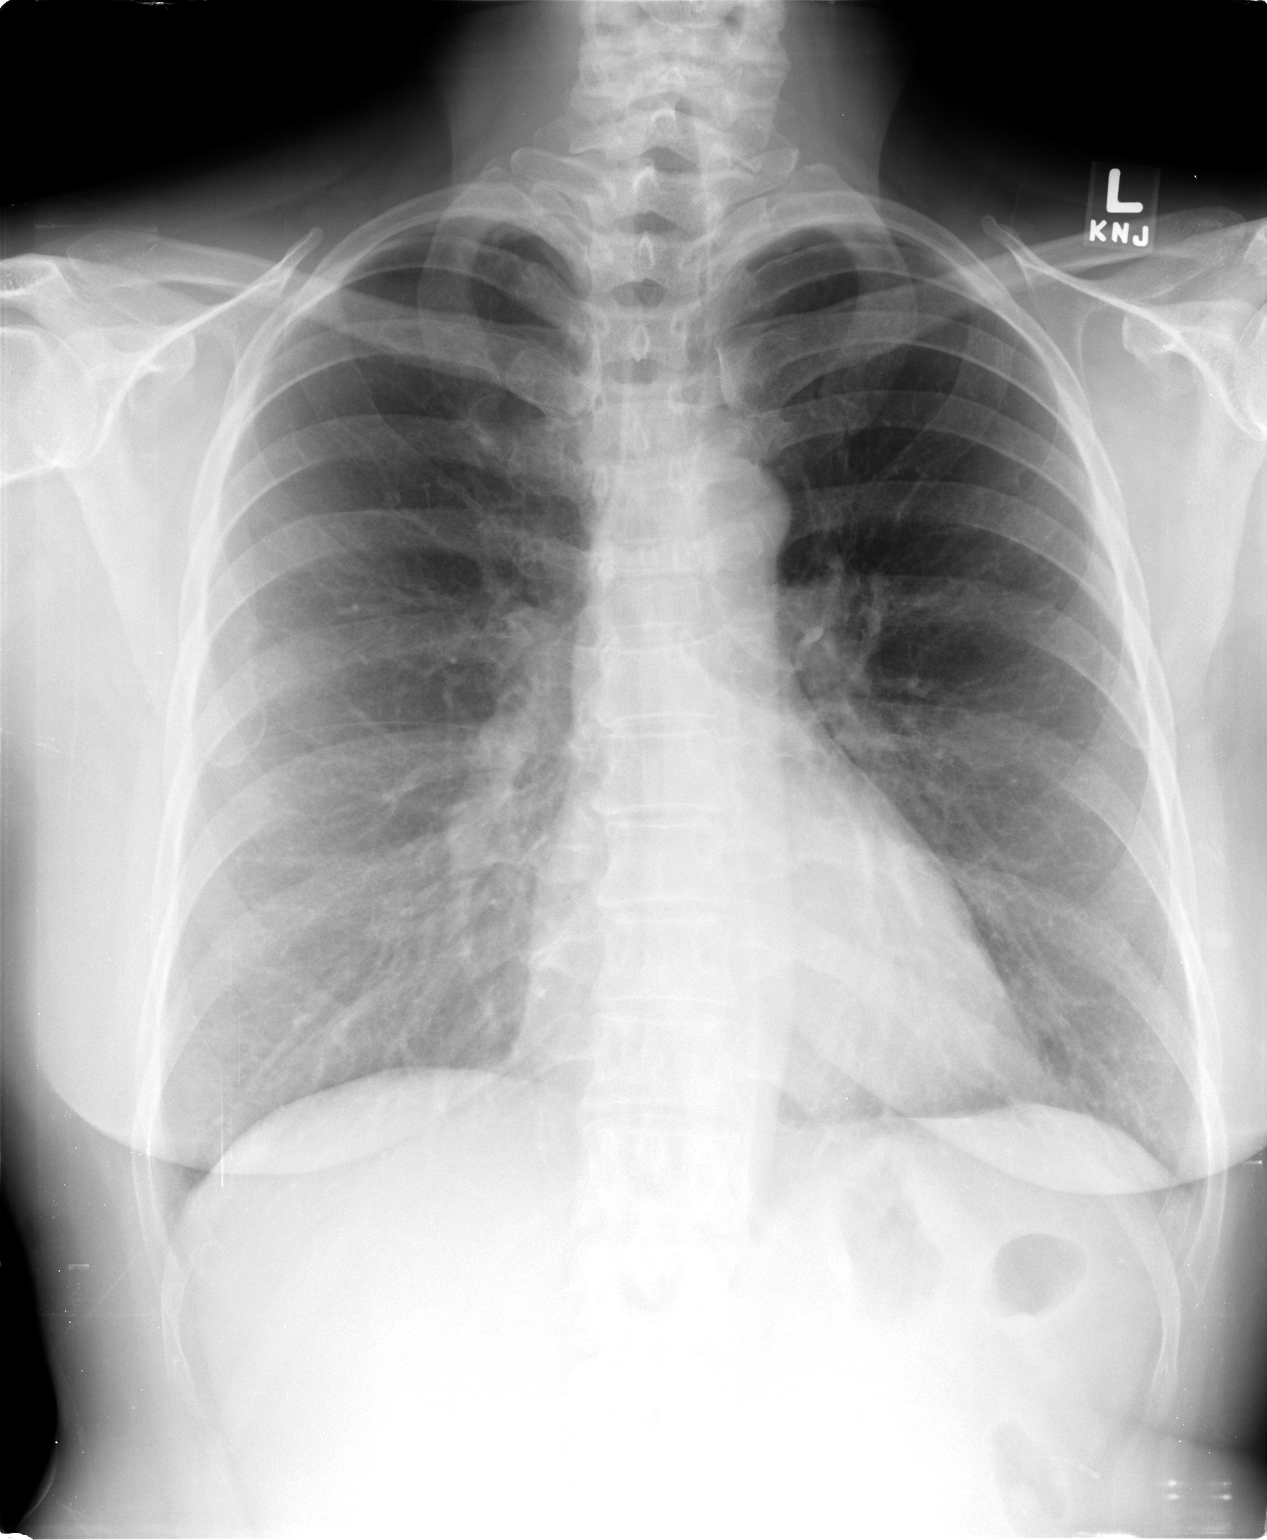

[view not recorded (2 of 2)]
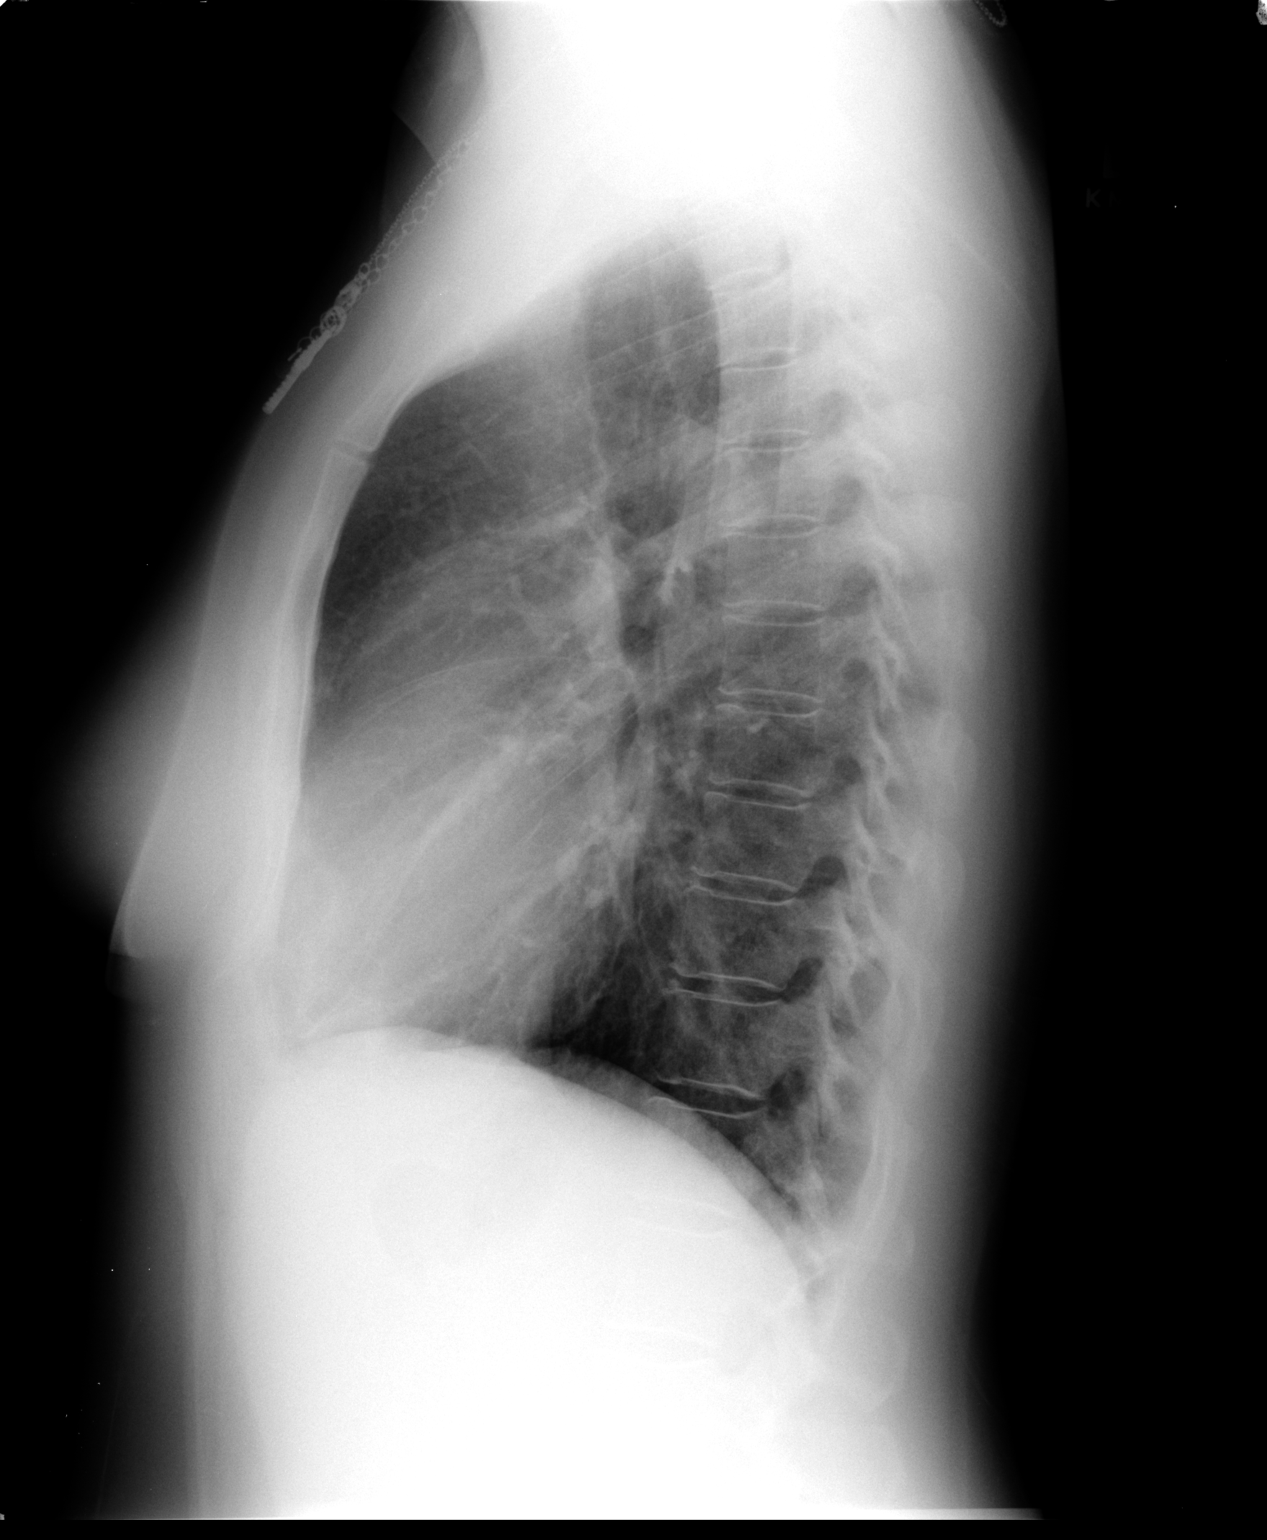

[2 of 2 positions shown; findings below may reference images not displayed]

FINDINGS: The lungs are clear without focal infiltrate, edema,
pneumothorax or pleural effusion. The cardiopericardial silhouette
is within normal limits for size. Imaged bony structures of the
thorax are intact.
IMPRESSION: No acute cardiopulmonary findings.

## 2014-02-26 ENCOUNTER — Other Ambulatory Visit: Payer: Medicaid Other

## 2014-02-26 DIAGNOSIS — Z79899 Other long term (current) drug therapy: Secondary | ICD-10-CM

## 2014-02-26 DIAGNOSIS — Z0000 Encounter for general adult medical examination without abnormal findings: Secondary | ICD-10-CM

## 2014-02-26 DIAGNOSIS — E785 Hyperlipidemia, unspecified: Secondary | ICD-10-CM

## 2014-02-27 LAB — CBC WITH DIFFERENTIAL/PLATELET
Basophils Absolute: 0 10*3/uL (ref 0.0–0.1)
Basophils Relative: 0 % (ref 0–1)
EOS ABS: 0.3 10*3/uL (ref 0.0–0.7)
EOS PCT: 3 % (ref 0–5)
HEMATOCRIT: 46.7 % — AB (ref 36.0–46.0)
Hemoglobin: 16.1 g/dL — ABNORMAL HIGH (ref 12.0–15.0)
Lymphocytes Relative: 35 % (ref 12–46)
Lymphs Abs: 3.7 10*3/uL (ref 0.7–4.0)
MCH: 31.8 pg (ref 26.0–34.0)
MCHC: 34.5 g/dL (ref 30.0–36.0)
MCV: 92.3 fL (ref 78.0–100.0)
MONO ABS: 0.5 10*3/uL (ref 0.1–1.0)
Monocytes Relative: 5 % (ref 3–12)
Neutro Abs: 6 10*3/uL (ref 1.7–7.7)
Neutrophils Relative %: 57 % (ref 43–77)
Platelets: 314 10*3/uL (ref 150–400)
RBC: 5.06 MIL/uL (ref 3.87–5.11)
RDW: 14.1 % (ref 11.5–15.5)
WBC: 10.5 10*3/uL (ref 4.0–10.5)

## 2014-02-27 LAB — COMPREHENSIVE METABOLIC PANEL
ALT: 15 U/L (ref 0–35)
AST: 23 U/L (ref 0–37)
Albumin: 4.7 g/dL (ref 3.5–5.2)
Alkaline Phosphatase: 95 U/L (ref 39–117)
BILIRUBIN TOTAL: 0.6 mg/dL (ref 0.2–1.2)
BUN: 11 mg/dL (ref 6–23)
CALCIUM: 10.2 mg/dL (ref 8.4–10.5)
CO2: 28 meq/L (ref 19–32)
CREATININE: 0.73 mg/dL (ref 0.50–1.10)
Chloride: 102 mEq/L (ref 96–112)
Glucose, Bld: 89 mg/dL (ref 70–99)
Potassium: 5 mEq/L (ref 3.5–5.3)
Sodium: 139 mEq/L (ref 135–145)
Total Protein: 8 g/dL (ref 6.0–8.3)

## 2014-02-27 LAB — LIPID PANEL
CHOL/HDL RATIO: 6.4 ratio
CHOLESTEROL: 331 mg/dL — AB (ref 0–200)
HDL: 52 mg/dL (ref >39)
LDL Cholesterol: 244 mg/dL — ABNORMAL HIGH (ref 0–99)
Triglycerides: 177 mg/dL — ABNORMAL HIGH (ref <150)
VLDL: 35 mg/dL (ref 0–40)

## 2014-02-27 LAB — TSH: TSH: 3.94 u[IU]/mL (ref 0.350–4.500)

## 2014-03-11 ENCOUNTER — Encounter: Payer: Self-pay | Admitting: Family Medicine

## 2014-03-11 ENCOUNTER — Ambulatory Visit (INDEPENDENT_AMBULATORY_CARE_PROVIDER_SITE_OTHER): Payer: Medicaid Other | Admitting: Family Medicine

## 2014-03-11 VITALS — BP 122/68 | HR 72 | Temp 97.6°F | Resp 16 | Ht 63.0 in | Wt 151.0 lb

## 2014-03-11 DIAGNOSIS — Z0000 Encounter for general adult medical examination without abnormal findings: Secondary | ICD-10-CM

## 2014-03-11 DIAGNOSIS — E785 Hyperlipidemia, unspecified: Secondary | ICD-10-CM

## 2014-03-11 MED ORDER — ROSUVASTATIN CALCIUM 20 MG PO TABS: ORAL_TABLET | ORAL | Status: DC

## 2014-03-11 NOTE — Progress Notes (Signed)
Subjective:    Patient ID: Michelle Clayton, female    DOB: 02-22-1966, 48 y.o.   MRN: [id number]  HPI  Patient is here today for complete physical exam. Patient has had a hysterectomy and therefore does not require Pap smears. She is overdue for mammogram. Unfortunately she continues to smoke and she has no desire to quit smoking at this time. Her most recent lab work as listed below. The patient quit taking her Niaspan and crestor. Lab on 02/26/2014  Component Date Value Ref Range Status  . WBC 02/26/2014 10.5  4.0 - 10.5 K/uL Final  . RBC 02/26/2014 5.06  3.87 - 5.11 MIL/uL Final  . Hemoglobin 02/26/2014 16.1* 12.0 - 15.0 g/dL Final  . HCT 02/26/2014 46.7* 36.0 - 46.0 % Final  . MCV 02/26/2014 92.3  78.0 - 100.0 fL Final  . MCH 02/26/2014 31.8  26.0 - 34.0 pg Final  . MCHC 02/26/2014 34.5  30.0 - 36.0 g/dL Final  . RDW 02/26/2014 14.1  11.5 - 15.5 % Final  . Platelets 02/26/2014 314  150 - 400 K/uL Final  . Neutrophils Relative % 02/26/2014 57  43 - 77 % Final  . Neutro Abs 02/26/2014 6.0  1.7 - 7.7 K/uL Final  . Lymphocytes Relative 02/26/2014 35  12 - 46 % Final  . Lymphs Abs 02/26/2014 3.7  0.7 - 4.0 K/uL Final  . Monocytes Relative 02/26/2014 5  3 - 12 % Final  . Monocytes Absolute 02/26/2014 0.5  0.1 - 1.0 K/uL Final  . Eosinophils Relative 02/26/2014 3  0 - 5 % Final  . Eosinophils Absolute 02/26/2014 0.3  0.0 - 0.7 K/uL Final  . Basophils Relative 02/26/2014 0  0 - 1 % Final  . Basophils Absolute 02/26/2014 0.0  0.0 - 0.1 K/uL Final  . Smear Review 02/26/2014 Criteria for review not met   Final  . Sodium 02/26/2014 139  135 - 145 mEq/L Final  . Potassium 02/26/2014 5.0  3.5 - 5.3 mEq/L Final  . Chloride 02/26/2014 102  96 - 112 mEq/L Final  . CO2 02/26/2014 28  19 - 32 mEq/L Final  . Glucose, Bld 02/26/2014 89  70 - 99 mg/dL Final  . BUN 02/26/2014 11  6 - 23 mg/dL Final  . Creat 02/26/2014 0.73  0.50 - 1.10 mg/dL Final  . Total Bilirubin 02/26/2014 0.6  0.2 - 1.2  mg/dL Final  . Alkaline Phosphatase 02/26/2014 95  39 - 117 U/L Final  . AST 02/26/2014 23  0 - 37 U/L Final  . ALT 02/26/2014 15  0 - 35 U/L Final  . Total Protein 02/26/2014 8.0  6.0 - 8.3 g/dL Final  . Albumin 02/26/2014 4.7  3.5 - 5.2 g/dL Final  . Calcium 02/26/2014 10.2  8.4 - 10.5 mg/dL Final  . Cholesterol 02/26/2014 331* 0 - 200 mg/dL Final   Comment: ATP III Classification:                                < 200        mg/dL        Desirable                               200 - 239     mg/dL        Borderline High                               >=  240        mg/dL        High                             . Triglycerides 02/26/2014 177* <150 mg/dL Final  . HDL 02/26/2014 52  >39 mg/dL Final  . Total CHOL/HDL Ratio 02/26/2014 6.4   Final  . VLDL 02/26/2014 35  0 - 40 mg/dL Final  . LDL Cholesterol 02/26/2014 244* 0 - 99 mg/dL Final   Comment:                            Total Cholesterol/HDL Ratio:CHD Risk                                                 Coronary Heart Disease Risk Table                                                                 Men       Women                                   1/2 Average Risk              3.4        3.3                                       Average Risk              5.0        4.4                                    2X Average Risk              9.6        7.1                                    3X Average Risk             23.4       11.0                          Use the calculated Patient Ratio above and the CHD Risk table                           to determine the patient's CHD Risk.                          ATP III Classification (LDL):                                <  100        mg/dL         Optimal                               100 - 129     mg/dL         Near or Above Optimal                               130 - 159     mg/dL         Borderline High                               160 - 189     mg/dL         High                                 > 190        mg/dL         Very High                             . TSH 02/26/2014 3.940  0.350 - 4.500 uIU/mL Final   Past Medical History  Diagnosis Date  . Hyperlipidemia   . Current smoker    Past Surgical History  Procedure Laterality Date  . Abdominal hysterectomy     No current outpatient prescriptions on file prior to visit.   No current facility-administered medications on file prior to visit.   No Known Allergies History   Social History  . Marital Status: Legally Separated    Spouse Name: N/A    Number of Children: N/A  . Years of Education: N/A   Occupational History  . Not on file.   Social History Main Topics  . Smoking status: Current Every Day Smoker -- 1.00 packs/day    Types: Cigarettes  . Smokeless tobacco: Not on file  . Alcohol Use: No  . Drug Use: No  . Sexual Activity: Yes     Comment: works in [employer] care, cares for her grandchildren who have ADD   Other Topics Concern  . Not on file   Social History Narrative  . No narrative on file   Family History  Problem Relation Age of Onset  . COPD Mother   . Cancer Father     lung, liver, brain primary unknown  . Heart disease Brother 70    MI  . Cancer Paternal Grandfather     colon     Review of Systems  All other systems reviewed and are negative.      Objective:   Physical Exam  Vitals reviewed. Constitutional: She is oriented to person, place, and time. She appears well-developed and well-nourished. No distress.  HENT:  Head: Normocephalic and atraumatic.  Right Ear: External ear normal.  Left Ear: External ear normal.  Nose: Nose normal.  Mouth/Throat: No oropharyngeal exudate.  Eyes: Conjunctivae and EOM are normal. Pupils are equal, round, and reactive to light. Right eye exhibits no discharge. Left eye exhibits no discharge. No scleral icterus.  Neck: Normal range of motion. Neck supple. No JVD present. No tracheal deviation present. No thyromegaly present.  Cardiovascular: Normal rate, regular rhythm, normal heart sounds and intact distal pulses.  Exam reveals no gallop and no friction rub.   No murmur heard. Pulmonary/Chest: Effort normal and breath sounds normal. No stridor. No respiratory distress. She has no wheezes. She has no rales. She exhibits no tenderness.  Abdominal: Soft. Bowel sounds are normal. She exhibits no distension and no mass. There is no tenderness. There is no rebound and no guarding.  Musculoskeletal: Normal range of motion. She exhibits no edema and no tenderness.  Lymphadenopathy:    She has no cervical adenopathy.  Neurological: She is alert and oriented to person, place, and time. She has normal reflexes. She displays normal reflexes. No cranial nerve deficit. She exhibits normal muscle tone. Coordination normal.  Skin: Skin is warm. No rash noted. She is not diaphoretic. No erythema. No pallor.  Psychiatric: She has a normal mood and affect. Her behavior is normal. Judgment and thought content normal.          Assessment & Plan:  1. HLD (hyperlipidemia) - rosuvastatin (CRESTOR) 20 MG tablet; TAKE 1 TABLET (20 MG TOTAL) BY MOUTH AT BEDTIME.  Dispense: 30 tablet; Refill: 3  2. Routine general medical examination at a health care facility I will schedule patient for mammogram. She is not yet due for colonoscopy. She no longer requires Pap smears. I strongly encouraged her to consider smoking cessation. I've asked the patient to resume Crestor 20 mg by mouth daily and return in 3 months and we can recheck a CMP as well as fasting lipid panel. Regular anticipatory guidance was provided

## 2014-03-12 ENCOUNTER — Encounter: Payer: Self-pay | Admitting: *Deleted

## 2014-04-04 ENCOUNTER — Ambulatory Visit: Payer: Medicaid Other

## 2014-04-08 ENCOUNTER — Other Ambulatory Visit: Payer: Self-pay | Admitting: *Deleted

## 2014-04-08 ENCOUNTER — Telehealth: Payer: Self-pay | Admitting: *Deleted

## 2014-04-08 DIAGNOSIS — E785 Hyperlipidemia, unspecified: Secondary | ICD-10-CM

## 2014-04-08 MED ORDER — ROSUVASTATIN CALCIUM 20 MG PO TABS: ORAL_TABLET | ORAL | Status: DC

## 2014-04-08 NOTE — Telephone Encounter (Signed)
Received fax requesting refill on Crestor.   Refill appropriate and filled per protocol.  

## 2014-04-08 NOTE — Telephone Encounter (Signed)
Pt called stating she is wanting her prescription sent to [facility] [place], order was changed to that pharmacy at pt request.

## 2014-04-12 ENCOUNTER — Telehealth: Payer: Self-pay | Admitting: *Deleted

## 2014-04-12 NOTE — Telephone Encounter (Signed)
Received fax requesting PA on Crestor.   PA submitted.

## 2014-04-19 NOTE — Telephone Encounter (Signed)
Received PA determination.   PA approved 04/12/2014- 04/07/2015.  [id number].

## 2014-09-08 ENCOUNTER — Other Ambulatory Visit: Payer: Self-pay | Admitting: Family Medicine

## 2014-11-24 ENCOUNTER — Encounter (HOSPITAL_COMMUNITY): Payer: Self-pay | Admitting: Emergency Medicine

## 2014-11-24 ENCOUNTER — Emergency Department (HOSPITAL_COMMUNITY)
Admission: EM | Admit: 2014-11-24 | Discharge: 2014-11-24 | Disposition: A | Discharge status: Home / Self Care | Payer: Medicaid Other | Attending: Emergency Medicine | Admitting: Emergency Medicine

## 2014-11-24 DIAGNOSIS — M545 Low back pain, unspecified: Secondary | ICD-10-CM

## 2014-11-24 DIAGNOSIS — Z720 Tobacco use: Secondary | ICD-10-CM | POA: Insufficient documentation

## 2014-11-24 DIAGNOSIS — Y9389 Activity, other specified: Secondary | ICD-10-CM | POA: Diagnosis not present

## 2014-11-24 DIAGNOSIS — Z79899 Other long term (current) drug therapy: Secondary | ICD-10-CM | POA: Diagnosis not present

## 2014-11-24 DIAGNOSIS — Y92410 Unspecified street and highway as the place of occurrence of the external cause: Secondary | ICD-10-CM | POA: Diagnosis not present

## 2014-11-24 DIAGNOSIS — S3992XA Unspecified injury of lower back, initial encounter: Secondary | ICD-10-CM | POA: Diagnosis present

## 2014-11-24 DIAGNOSIS — Y998 Other external cause status: Secondary | ICD-10-CM | POA: Insufficient documentation

## 2014-11-24 DIAGNOSIS — Z8639 Personal history of other endocrine, nutritional and metabolic disease: Secondary | ICD-10-CM | POA: Diagnosis not present

## 2014-11-24 MED ORDER — CYCLOBENZAPRINE HCL 10 MG PO TABS: 10.0000 mg | ORAL_TABLET | Freq: Three times a day (TID) | ORAL | Status: DC | PRN

## 2014-11-24 MED ORDER — IBUPROFEN 800 MG PO TABS: 800.0000 mg | ORAL_TABLET | Freq: Three times a day (TID) | ORAL | Status: DC | PRN

## 2014-11-24 NOTE — ED Notes (Signed)
Patient in gown 
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
## 2014-12-01 ENCOUNTER — Encounter: Payer: Self-pay | Admitting: Family Medicine

## 2014-12-01 ENCOUNTER — Ambulatory Visit (INDEPENDENT_AMBULATORY_CARE_PROVIDER_SITE_OTHER): Payer: Medicaid Other | Admitting: Family Medicine

## 2014-12-01 VITALS — BP 126/80 | HR 74 | Temp 98.1°F | Resp 16 | Ht 63.0 in | Wt 168.0 lb

## 2014-12-01 DIAGNOSIS — M5442 Lumbago with sciatica, left side: Secondary | ICD-10-CM

## 2014-12-01 MED ORDER — PREDNISONE 20 MG PO TABS: ORAL_TABLET | ORAL | Status: DC

## 2014-12-01 MED ORDER — CARISOPRODOL 350 MG PO TABS: 350.0000 mg | ORAL_TABLET | Freq: Four times a day (QID) | ORAL | Status: DC | PRN
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
## 2016-02-11 ENCOUNTER — Ambulatory Visit (HOSPITAL_COMMUNITY)
Admission: EM | Admit: 2016-02-11 | Discharge: 2016-02-11 | Disposition: A | Discharge status: Home / Self Care | Payer: Medicaid Other | Attending: Emergency Medicine | Admitting: Emergency Medicine

## 2016-02-11 ENCOUNTER — Encounter (HOSPITAL_COMMUNITY): Payer: Self-pay | Admitting: Emergency Medicine

## 2016-02-11 DIAGNOSIS — Z205 Contact with and (suspected) exposure to viral hepatitis: Secondary | ICD-10-CM | POA: Insufficient documentation

## 2016-02-11 NOTE — ED Notes (Signed)
[facility] with a complaint of requesting to be tested for Hep C. The patient stated that her granddaughter tested positive for Hep C.

## 2016-02-11 NOTE — Discharge Instructions (Signed)
AWAIT YOUR TESTS RESULTS Hepatitis C Hepatitis C is a liver infection. It is caused by a germ that can spread through blood and other bodily fluids. Your doctor will use blood and liver tests to:  Check for this infection.  Decide how to treat you.  Check your health after treatment. HOME CARE  Rest.  Do not take any medicine unless your doctor says it is okay. This includes over-the-counter medicine and birth control pills.  Do not drink alcohol.  Do not have sex until your doctor says it is okay.  Do not share toothbrushes, nail clippers, razors, or needles.  Take all medicines as told by your doctor. GET HELP IF:  You have a fever.  Your belly (abdomen) hurts.  Your pee (urine) is dark.  Your poop (bowel movement) is the color of clay.  You have joint pain. GET HELP RIGHT AWAY IF:  You feel more and more tired (fatigued).  You feel more and more weak.  You do not feel like eating.  You feel sick to your stomach (nauseous) or throw up (vomit).  Your skin or the whites of your eyes turn yellow (jaundice) or turn more yellow than they were before.  You bruise or bleed easily. MAKE SURE YOU:  Understand these instructions.  Will watch your condition.  Will get help right away if you are not doing well or get worse.   This information is not intended to replace advice given to you by your health care provider. Make sure you discuss any questions you have with your health care provider.   Document Released: 07/14/2008 Document Revised: 12/16/2014 Document Reviewed: 11/13/2013 Elsevier Interactive Patient Education [employer]2016 Elsevier Inc.

## 2016-02-11 NOTE — ED Provider Notes (Signed)
CSN: [id number]     Arrival date & time 02/11/16  1416 History   First MD Initiated Contact with Patient 02/11/16 1446     Chief Complaint  Patient presents with  . Hepatitis C   (Consider location/radiation/quality/duration/timing/severity/associated sxs/prior Treatment) HPI Patient presents at the encouragement of her primary care provider because her granddaughter has tested positive for hepatitis C. She was encouraged to be tested. Patient has no symptoms no pain no associated symptoms at this time no home treatment. Past Medical History  Diagnosis Date  . Hyperlipidemia   . Current smoker    Past Surgical History  Procedure Laterality Date  . Abdominal hysterectomy     Family History  Problem Relation Age of Onset  . COPD Mother   . Cancer Father     lung, liver, brain primary unknown  . Heart disease Brother [id number]    MI  . Cancer Paternal Grandfather     colon   Social History  Substance Use Topics  . Smoking status: Current Every Day Smoker -- 1.00 packs/day    Types: Cigarettes  . Smokeless tobacco: None  . Alcohol Use: No   OB History    No data available     Review of Systems  Denies: HEADACHE, NAUSEA, ABDOMINAL PAIN, CHEST PAIN, CONGESTION, DYSURIA, SHORTNESS OF BREATH  Allergies  Aspirin  Home Medications   Prior to Admission medications   Medication Sig Start Date End Date Taking? Authorizing Provider  carisoprodol (SOMA) 350 MG tablet Take 1 tablet (350 mg total) by mouth 4 (four) times daily as needed for muscle spasms. 12/01/14   Donita BrooksWarren T Pickard, MD  CRESTOR 20 MG tablet TAKE 1 TABLET BY MOUTH AT BEDTIME 09/08/14   Donita BrooksWarren T Pickard, MD  predniSONE (DELTASONE) 20 MG tablet 3 tabs poqday 1-2, 2 tabs poqday 3-4, 1 tab poqday 5-6 12/01/14   Donita BrooksWarren T Pickard, MD   Meds Ordered and Administered this Visit  Medications - No data to display  BP 119/80 mmHg  Pulse 93  Temp(Src) 99.3 F (37.4 C) (Oral)  Resp 20  SpO2 95% No data found.   Physical  Exam NURSES NOTES AND VITAL SIGNS REVIEWED. CONSTITUTIONAL: Well developed, well nourished, no acute distress HEENT: normocephalic, atraumatic EYES: Conjunctiva normal NECK:normal ROM, supple, no adenopathy PULMONARY:No respiratory distress, normal effort ABDOMINAL: Soft, ND, NT BS+, No CVAT MUSCULOSKELETAL: Normal ROM of all extremities,  SKIN: warm and dry without rash PSYCHIATRIC: Mood and affect, behavior are normal  ED Course  Procedures (including critical care time)  Labs Review Labs Reviewed  HEPATITIS PANEL, ACUTE    Imaging Review No results found.   Visual Acuity Review  Right Eye Distance:   Left Eye Distance:   Bilateral Distance:    Right Eye Near:   Left Eye Near:    Bilateral Near:      Test results will be called to you next week   MDM   1. Exposure to hepatitis C     Patient is reassured that there are no issues that require transfer to higher level of care at this time or additional tests. Patient is advised to continue home symptomatic treatment. Patient is advised that if there are new or worsening symptoms to attend the emergency department, contact primary care provider, or return to UC. Instructions of care provided discharged home in stable condition.    THIS NOTE WAS GENERATED USING A VOICE RECOGNITION SOFTWARE PROGRAM. ALL REASONABLE EFFORTS  WERE MADE TO PROOFREAD THIS DOCUMENT FOR  ACCURACY.  I have verbally reviewed the discharge instructions with the patient. A printed AVS was given to the patient.  All questions were answered prior to discharge.      Tharon AquasFrank C Kano Heckmann, PA 02/11/16 1902

## 2016-02-12 LAB — HEPATITIS PANEL, ACUTE
HCV AB: 0.1 {s_co_ratio} (ref 0.0–0.9)
HEP B C IGM: NEGATIVE
HEP B S AG: NEGATIVE
Hep A IgM: NEGATIVE

## 2016-11-07 ENCOUNTER — Encounter: Payer: Self-pay | Admitting: Gastroenterology

## 2016-11-07 ENCOUNTER — Ambulatory Visit (INDEPENDENT_AMBULATORY_CARE_PROVIDER_SITE_OTHER): Payer: Commercial Managed Care - PPO | Admitting: Family Medicine

## 2016-11-07 ENCOUNTER — Encounter: Payer: Self-pay | Admitting: Family Medicine

## 2016-11-07 VITALS — BP 130/86 | HR 84 | Temp 97.9°F | Resp 14 | Ht 63.0 in | Wt 148.0 lb

## 2016-11-07 DIAGNOSIS — Z1211 Encounter for screening for malignant neoplasm of colon: Secondary | ICD-10-CM

## 2016-11-07 DIAGNOSIS — Z0000 Encounter for general adult medical examination without abnormal findings: Secondary | ICD-10-CM

## 2016-11-07 DIAGNOSIS — Z1231 Encounter for screening mammogram for malignant neoplasm of breast: Secondary | ICD-10-CM

## 2016-11-07 DIAGNOSIS — Z23 Encounter for immunization: Secondary | ICD-10-CM | POA: Diagnosis not present

## 2016-11-07 DIAGNOSIS — Z1239 Encounter for other screening for malignant neoplasm of breast: Secondary | ICD-10-CM

## 2016-11-07 NOTE — Progress Notes (Signed)
Subjective:    Patient ID: Michelle Clayton, female    DOB: Mar 02, 1966, 51 y.o.   MRN: [id number]  HPI 02/2014 Patient is here today for complete physical exam. Patient has had a hysterectomy and therefore does not require Pap smears. She is overdue for mammogram. Unfortunately she continues to smoke and she has no desire to quit smoking at this time. Her most recent lab work as listed below. The patient quit taking her Niaspan and crestor.  At that time, my plan was: 1. HLD (hyperlipidemia) - rosuvastatin (CRESTOR) 20 MG tablet; TAKE 1 TABLET (20 MG TOTAL) BY MOUTH AT BEDTIME.  Dispense: 30 tablet; Refill: 3  2. Routine general medical examination at a health care facility I will schedule patient for mammogram. She is not yet due for colonoscopy. She no longer requires Pap smears. I strongly encouraged her to consider smoking cessation. I've asked the patient to resume Crestor 20 mg by mouth daily and return in 3 months and we can recheck a CMP as well as fasting lipid panel. Regular anticipatory guidance was provided  11/07/16  Patient has not been seen in quite some time. She is no longer taking Crestor. Unfortunately she continues to smoke although now she smoking less than a pack a day she is due for a tetanus shot. She is also due for the shingles vaccine. I encouraged her to check on the price of the shingles vaccine and if she wants a week and send a prescription to her pharmacy. She would like to receive the tetanus shot today. She is overdue for mammogram. She is also due for colonoscopy. Otherwise she has no concerns     Past Medical History:  Diagnosis Date  . Current smoker   . Hyperlipidemia    Past Surgical History:  Procedure Laterality Date  . ABDOMINAL HYSTERECTOMY     Current Outpatient Prescriptions on File Prior to Visit  Medication Sig Dispense Refill  . CRESTOR 20 MG tablet TAKE 1 TABLET BY MOUTH AT BEDTIME (Patient not taking: Reported on 11/07/2016) 30 tablet 3     No current facility-administered medications on file prior to visit.    Allergies  Allergen Reactions  . Aspirin    Social History   Social History  . Marital status: Legally Separated    Spouse name: N/A  . Number of children: N/A  . Years of education: N/A   Occupational History  . Not on file.   Social History Main Topics  . Smoking status: Current Every Day Smoker    Packs/day: 1.00    Types: Cigarettes  . Smokeless tobacco: Never Used  . Alcohol use No  . Drug use: No  . 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 Not on file   Social History Narrative  . No narrative on file   Family History  Problem Relation Age of Onset  . COPD Mother   . Cancer Father     lung, liver, brain primary unknown  . Heart disease Brother 60    MI  . Cancer Paternal Grandfather     colon     Review of Systems  All other systems reviewed and are negative.      Objective:   Physical Exam  Constitutional: She is oriented to person, place, and time. She appears well-developed and well-nourished. No distress.  HENT:  Head: Normocephalic and atraumatic.  Right Ear: External ear normal.  Left Ear: External ear normal.  Nose: Nose normal.  Mouth/Throat: No oropharyngeal exudate.  Eyes: Conjunctivae and EOM are normal. Pupils are equal, round, and reactive to light. Right eye exhibits no discharge. Left eye exhibits no discharge. No scleral icterus.  Neck: Normal range of motion. Neck supple. No JVD present. No tracheal deviation present. No thyromegaly present.  Cardiovascular: Normal rate, regular rhythm, normal heart sounds and intact distal pulses.  Exam reveals no gallop and no friction rub.   No murmur heard. Pulmonary/Chest: Effort normal and breath sounds normal. No stridor. No respiratory distress. She has no wheezes. She has no rales. She exhibits no tenderness.  Abdominal: Soft. Bowel  sounds are normal. She exhibits no distension and no mass. There is no tenderness. There is no rebound and no guarding.  Musculoskeletal: Normal range of motion. She exhibits no edema or tenderness.  Lymphadenopathy:    She has no cervical adenopathy.  Neurological: She is alert and oriented to person, place, and time. She has normal reflexes. No cranial nerve deficit. She exhibits normal muscle tone. Coordination normal.  Skin: Skin is warm. No rash noted. She is not diaphoretic. No erythema. No pallor.  Psychiatric: She has a normal mood and affect. Her behavior is normal. Judgment and thought content normal.  Vitals reviewed.         Assessment & Plan:  General medical exam - Plan: CBC with Differential/Platelet, COMPLETE METABOLIC PANEL WITH GFR, Lipid panel  Continue to encourage smoking cessation. I will schedule the patient for colonoscopy. Goal LDL cholesterol is less than 100. If above goal, I would recommend resuming Crestor and rechecking labs in 3 months. I will also schedule the patient for mammogram. I will check a CBC, CMP, fasting lipid panel. Patient will receive the tetanus shot today. At the present time, she has no interest in trying Chantix for smoking cessation

## 2016-11-07 NOTE — Addendum Note (Signed)
Addended by: Legrand RamsWILLIS, SANDY B on: 11/07/2016 03:41 PM   Modules accepted: Orders

## 2016-11-08 ENCOUNTER — Encounter: Payer: Self-pay | Admitting: Family Medicine

## 2016-11-08 LAB — CBC WITH DIFFERENTIAL/PLATELET
Basophils Absolute: 0 cells/uL (ref 0–200)
Basophils Relative: 0 %
EOS PCT: 2 %
Eosinophils Absolute: 236 cells/uL (ref 15–500)
HCT: 42.3 % (ref 35.0–45.0)
Hemoglobin: 14.5 g/dL (ref 12.0–15.0)
Lymphocytes Relative: 31 %
Lymphs Abs: 3658 cells/uL (ref 850–3900)
MCH: 32.3 pg (ref 27.0–33.0)
MCHC: 34.3 g/dL (ref 32.0–36.0)
MCV: 94.2 fL (ref 80.0–100.0)
MPV: 9.9 fL (ref 7.5–12.5)
Monocytes Absolute: 590 cells/uL (ref 200–950)
Monocytes Relative: 5 %
NEUTROS ABS: 7316 {cells}/uL (ref 1500–7800)
Neutrophils Relative %: 62 %
Platelets: 316 10*3/uL (ref 140–400)
RBC: 4.49 MIL/uL (ref 3.80–5.10)
RDW: 13.8 % (ref 11.0–15.0)
WBC: 11.8 10*3/uL — ABNORMAL HIGH (ref 3.8–10.8)

## 2016-11-08 LAB — COMPLETE METABOLIC PANEL WITH GFR
ALK PHOS: 66 U/L (ref 33–130)
ALT: 13 U/L (ref 6–29)
AST: 19 U/L (ref 10–35)
Albumin: 4.2 g/dL (ref 3.6–5.1)
BUN: 16 mg/dL (ref 7–25)
CO2: 25 mmol/L (ref 20–31)
Calcium: 9.7 mg/dL (ref 8.6–10.4)
Chloride: 104 mmol/L (ref 98–110)
Creat: 0.67 mg/dL (ref 0.50–1.05)
GFR, Est African American: >89 mL/min (ref >=60)
GLUCOSE: 71 mg/dL (ref 70–99)
Potassium: 3.9 mmol/L (ref 3.5–5.3)
SODIUM: 139 mmol/L (ref 135–146)
Total Bilirubin: 0.7 mg/dL (ref 0.2–1.2)
Total Protein: 7.4 g/dL (ref 6.1–8.1)

## 2016-11-08 LAB — LIPID PANEL
CHOL/HDL RATIO: 6.5 ratio — AB (ref <5.0)
Cholesterol: 272 mg/dL — ABNORMAL HIGH (ref <200)
HDL: 42 mg/dL — ABNORMAL LOW (ref >50)
LDL CALC: 212 mg/dL — AB (ref <100)
Triglycerides: 90 mg/dL (ref <150)
VLDL: 18 mg/dL (ref <30)

## 2016-11-28 ENCOUNTER — Ambulatory Visit: Payer: Medicaid Other

## 2016-12-26 ENCOUNTER — Encounter: Payer: Self-pay | Admitting: Family Medicine

## 2017-01-06 ENCOUNTER — Encounter: Payer: Medicaid Other | Admitting: Gastroenterology

## 2017-07-27 ENCOUNTER — Encounter: Payer: Self-pay | Admitting: Family Medicine

## 2017-07-27 ENCOUNTER — Ambulatory Visit (INDEPENDENT_AMBULATORY_CARE_PROVIDER_SITE_OTHER): Payer: No Typology Code available for payment source | Admitting: Family Medicine

## 2017-07-27 VITALS — BP 120/62 | HR 76 | Temp 98.3°F | Resp 16 | Ht 63.0 in | Wt 121.0 lb

## 2017-07-27 DIAGNOSIS — F17200 Nicotine dependence, unspecified, uncomplicated: Secondary | ICD-10-CM

## 2017-07-27 DIAGNOSIS — B9689 Other specified bacterial agents as the cause of diseases classified elsewhere: Secondary | ICD-10-CM | POA: Diagnosis not present

## 2017-07-27 DIAGNOSIS — J019 Acute sinusitis, unspecified: Secondary | ICD-10-CM

## 2017-07-27 DIAGNOSIS — J40 Bronchitis, not specified as acute or chronic: Secondary | ICD-10-CM | POA: Diagnosis not present

## 2017-07-27 MED ORDER — LEVOFLOXACIN 500 MG PO TABS: 500.0000 mg | ORAL_TABLET | Freq: Every day | ORAL | 0 refills | Status: DC

## 2017-07-27 MED ORDER — HYDROCODONE-HOMATROPINE 5-1.5 MG/5ML PO SYRP
5.0000 mL | ORAL_SOLUTION | Freq: Three times a day (TID) | ORAL | 0 refills | Status: DC | PRN
Start: 2017-07-27 — End: 2017-09-26

## 2017-07-27 NOTE — Progress Notes (Signed)
Subjective:    Patient ID: Michelle LabellaSuellen L Mago, female    DOB: 1965-10-26, 51 y.o.   MRN: [id number]  HPI  Patient has been sick for more than 3 weeks.  Began with an upper respiratory infection.  Patient is now having pain and pressure in her maxillary sinus and nasal bridge.  She has a constant sinus headache.  She also reports shortness of breath, cough productive of green sputum, audible wheezing at night.  She also reports subjective fevers.  She continues to smoke.  She discontinued her cholesterol medication.  She is also lost substantial weight unintentionally.  Past Medical History:  Diagnosis Date  . Current smoker   . Hyperlipidemia    Past Surgical History:  Procedure Laterality Date  . ABDOMINAL HYSTERECTOMY     Current Outpatient Medications on File Prior to Visit  Medication Sig Dispense Refill  . CRESTOR 20 MG tablet TAKE 1 TABLET BY MOUTH AT BEDTIME (Patient not taking: Reported on 11/07/2016) 30 tablet 3   No current facility-administered medications on file prior to visit.    Allergies  Allergen Reactions  . Aspirin    Social History   Socioeconomic History  . Marital status: Legally Separated    Spouse name: Not on file  . Number of children: Not on file  . Years of education: Not on file  . Highest education level: Not on file  Social Needs  . Financial resource strain: Not on file  . Food insecurity - worry: Not on file  . Food insecurity - inability: Not on file  . Transportation needs - medical: Not on file  . Transportation needs - non-medical: Not on file  Occupational History  . Not on file  Tobacco Use  . Smoking status: Current Every Day Smoker    Packs/day: 1.00    Types: Cigarettes  . Smokeless tobacco: Never Used  Substance and Sexual Activity  . Alcohol use: No  . Drug use: No  . 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 Not on file  Social History  Narrative  . Not on file      Review of Systems  All other systems reviewed and are negative.      Objective:   Physical Exam  Constitutional: She is oriented to person, place, and time.  HENT:  Right Ear: Tympanic membrane and ear canal normal.  Left Ear: Tympanic membrane and ear canal normal.  Nose: Mucosal edema and rhinorrhea present. Right sinus exhibits maxillary sinus tenderness.  Mouth/Throat: No oropharyngeal exudate, posterior oropharyngeal edema or posterior oropharyngeal erythema.  Cardiovascular: Normal rate and regular rhythm.  Pulmonary/Chest: Effort normal. No accessory muscle usage. No tachypnea. No respiratory distress. She has wheezes in the right upper field, the right lower field, the left upper field and the left lower field. She has rhonchi.  Neurological: She is alert and oriented to person, place, and time. She has normal reflexes. No cranial nerve deficit. She exhibits normal muscle tone. Coordination normal.  Vitals reviewed.         Assessment & Plan:  I believe the patient has sinusitis coupled with bronchitis.  I would recommend Levaquin 500 mg p.o. daily for 7 days and then recheck in 2 weeks.  Recheck immediately if worsening, I encouraged the patient to come in for a full physical exam.  At that point I would like to screen her for emphysema with  pulmonary function test, obtain a chest x-ray along with fasting lab work and update her cancer screening.  Continue to encourage smoking cessation

## 2017-09-26 ENCOUNTER — Encounter: Payer: Self-pay | Admitting: Family Medicine

## 2017-09-26 ENCOUNTER — Ambulatory Visit (INDEPENDENT_AMBULATORY_CARE_PROVIDER_SITE_OTHER): Payer: No Typology Code available for payment source | Admitting: Family Medicine

## 2017-09-26 VITALS — BP 118/70 | HR 80 | Temp 98.0°F | Resp 14 | Ht 63.0 in | Wt 125.0 lb

## 2017-09-26 DIAGNOSIS — F418 Other specified anxiety disorders: Secondary | ICD-10-CM

## 2017-09-26 MED ORDER — PROMETHAZINE HCL 25 MG PO TABS: 25.0000 mg | ORAL_TABLET | Freq: Three times a day (TID) | ORAL | 0 refills | Status: DC | PRN

## 2017-09-26 MED ORDER — ALPRAZOLAM 0.5 MG PO TABS: 0.5000 mg | ORAL_TABLET | Freq: Three times a day (TID) | ORAL | 0 refills | Status: DC | PRN
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
## 2017-12-11 ENCOUNTER — Encounter: Payer: Self-pay | Admitting: Family Medicine

## 2018-01-23 ENCOUNTER — Encounter: Payer: Self-pay | Admitting: Family Medicine

## 2022-09-26 ENCOUNTER — Other Ambulatory Visit: Payer: Self-pay | Admitting: Family Medicine

## 2022-09-26 DIAGNOSIS — Z1231 Encounter for screening mammogram for malignant neoplasm of breast: Secondary | ICD-10-CM

## 2022-10-05 ENCOUNTER — Other Ambulatory Visit (HOSPITAL_COMMUNITY): Payer: Self-pay | Admitting: Family Medicine

## 2022-10-05 DIAGNOSIS — F17200 Nicotine dependence, unspecified, uncomplicated: Secondary | ICD-10-CM

## 2022-10-05 DIAGNOSIS — Z122 Encounter for screening for malignant neoplasm of respiratory organs: Secondary | ICD-10-CM

## 2022-11-08 ENCOUNTER — Encounter (INDEPENDENT_AMBULATORY_CARE_PROVIDER_SITE_OTHER): Payer: Self-pay | Admitting: *Deleted

## 2023-01-23 ENCOUNTER — Ambulatory Visit (INDEPENDENT_AMBULATORY_CARE_PROVIDER_SITE_OTHER): Payer: BLUE CROSS/BLUE SHIELD | Admitting: Gastroenterology

## 2023-01-23 ENCOUNTER — Encounter (INDEPENDENT_AMBULATORY_CARE_PROVIDER_SITE_OTHER): Payer: Self-pay | Admitting: Gastroenterology

## 2023-01-23 VITALS — BP 137/78 | HR 68 | Temp 97.9°F | Ht 63.0 in | Wt 140.0 lb

## 2023-01-23 DIAGNOSIS — R195 Other fecal abnormalities: Secondary | ICD-10-CM

## 2023-01-23 NOTE — Patient Instructions (Signed)
As discussed, Cologuard is intended for the qualitative detection of colorectal neoplasia associated DNA markers and for the presence of occult hemoglobin in human stool. A positive result may indicate the presence of colorectal cancer (CRC) or pre cancerous polyps, and should be followed by colonoscopy. A negative Cologuard test result does not guarantee absence of cancer or pre cancerous polyp.False positives and false negatives do occur.  We will get you scheduled for a colonoscopy, follow up will be determined after the procedure

## 2023-01-23 NOTE — H&P (View-Only) (Signed)
 Referring Provider: Mullis, Kiersten P, DO Primary Care Physician:  Mullis, Kiersten P, DO Primary GI Physician: new   Chief Complaint  Patient presents with   positive cologuard    Referred for Positive cologuard test. Reports she has BM about once a week.    HPI:   Michelle Clayton is a 57 y.o. female with past medical history of HLD   Patient presenting today as a new patient for positive cologuard   She reports she did cologuard that was positive. No previous colonoscopy. Has BM once per week which is baseline, does not feel the need to strain. No red flag symptoms. Patient denies melena, hematochezia, nausea, vomiting, diarrhea, constipation, dysphagia, odyonophagia, early satiety or weight loss. She has no GI complaints today.   NSAID use: none  Social hx: no etoh, smokes 1 pack every few days, vapes  Fam hx: maternal grandfather had colon cancer in his 50s  Last Colonoscopy:never  Last Endoscopy: never   Recommendations:    Past Medical History:  Diagnosis Date   Current smoker    Hyperlipidemia     Past Surgical History:  Procedure Laterality Date   ABDOMINAL HYSTERECTOMY      Current Outpatient Medications  Medication Sig Dispense Refill   aspirin EC 81 MG tablet Take 81 mg by mouth daily. Swallow whole.     rosuvastatin (CRESTOR) 40 MG tablet Take 40 mg by mouth daily.     traZODone (DESYREL) 50 MG tablet Take 50 mg by mouth. Takes 2 at bedtime     No current facility-administered medications for this visit.    Allergies as of 01/23/2023   (No Active Allergies)    Family History  Problem Relation Age of Onset   COPD Mother    Cancer Father        lung, liver, brain primary unknown   Heart disease Brother 51       MI   Colon cancer Maternal Grandfather     Social History   Socioeconomic History   Marital status: Divorced    Spouse name: Not on file   Number of children: Not on file   Years of education: Not on file   Highest education  level: Not on file  Occupational History   Not on file  Tobacco Use   Smoking status: Every Day    Packs/day: 1    Types: Cigarettes    Passive exposure: Current   Smokeless tobacco: Never  Substance and Sexual Activity   Alcohol use: No   Drug use: No   Sexual activity: Yes    Comment: works in [employer], cares for her grandchildren who have ADD  Other Topics Concern   Not on file  Social History Narrative   Not on file   Social Determinants of Health   Financial Resource Strain: Not on file  Food Insecurity: Not on file  Transportation Needs: Not on file  Physical Activity: Not on file  Stress: Not on file  Social Connections: Not on file   Review of systems General: negative for malaise, night sweats, fever, chills, weight loss Neck: Negative for lumps, goiter, pain and significant neck swelling Resp: Negative for cough, wheezing, dyspnea at rest CV: Negative for chest pain, leg swelling, palpitations, orthopnea GI: denies melena, hematochezia, nausea, vomiting, diarrhea, constipation, dysphagia, odyonophagia, early satiety or unintentional weight loss.  MSK: Negative for joint pain or swelling, back pain, and muscle pain. Derm: Negative for itching or rash Psych: Denies depression, anxiety,   memory loss, confusion. No homicidal or suicidal ideation.  Heme: Negative for prolonged bleeding, bruising easily, and swollen nodes. Endocrine: Negative for cold or heat intolerance, polyuria, polydipsia and goiter. Neuro: negative for tremor, gait imbalance, syncope and seizures. The remainder of the review of systems is noncontributory.  Physical Exam: BP 137/78 (BP Location: Left Arm, Patient Position: Sitting, Cuff Size: Large)   Pulse 68   Temp 97.9 F (36.6 C) (Oral)   Ht 5' 3" (1.6 m)   Wt 140 lb (63.5 kg)   BMI 24.80 kg/m  General:   Alert and oriented. No distress noted. Pleasant and cooperative.  Head:  Normocephalic and atraumatic. Eyes:  Conjuctiva clear  without scleral icterus. Mouth:  Oral mucosa pink and moist. Good dentition. No lesions. Heart: Normal rate and rhythm, s1 and s2 heart sounds present.  Lungs: Clear lung sounds in all lobes. Respirations equal and unlabored. Abdomen:  +BS, soft, non-tender and non-distended. No rebound or guarding. No HSM or masses noted. Derm: No palmar erythema or jaundice Msk:  Symmetrical without gross deformities. Normal posture. Extremities:  Without edema. Neurologic:  Alert and  oriented x4 Psych:  Alert and cooperative. Normal mood and affect.  Invalid input(s): "6 MONTHS"   ASSESSMENT: Michelle Clayton is a 57 y.o. female presenting today as a new patient for positive cologuard  Positive cologuard in march 2024, no previous colonoscopy.  Discussed with the patient that Cologuard is intended for the qualitative detection of colorectal neoplasia associated DNA markers and for the presence of occult hemoglobin in human stool. A positive result may indicate the presence of colorectal cancer (CRC) or pre cancerous polyps, and should be followed by colonoscopy. A negative Cologuard test result does not guarantee absence of cancer or pre cancerous polyp. False positives and false negatives do occur.recommend proceeding with Colonoscopy at this time. Indications, risks and benefits of procedure discussed in detail with patient. Patient verbalized understanding and is in agreement to proceed with Colonoscopy    PLAN:  Schedule Colonoscopy-ASA II (2 day prep)  All questions were answered, patient verbalized understanding and is in agreement with plan as outlined above.   Follow up TBD   Gael Londo L. Fergus Throne, MSN, APRN, AGNP-C Adult-Gerontology Nurse Practitioner [facility]  I have reviewed the note and agree with the APP's assessment as described in this progress note  Daniel Castaneda, MD Gastroenterology and Hepatology [facility]  

## 2023-01-23 NOTE — H&P (View-Only) (Signed)
 Referring Provider: Mullis, Kiersten P, DO Primary Care Physician:  Mullis, Kiersten P, DO Primary GI Physician: new   Chief Complaint  Patient presents with   positive cologuard    Referred for Positive cologuard test. Reports she has BM about once a week.    HPI:   Michelle Clayton is a 57 y.o. female with past medical history of HLD   Patient presenting today as a new patient for positive cologuard   She reports she did cologuard that was positive. No previous colonoscopy. Has BM once per week which is baseline, does not feel the need to strain. No red flag symptoms. Patient denies melena, hematochezia, nausea, vomiting, diarrhea, constipation, dysphagia, odyonophagia, early satiety or weight loss. She has no GI complaints today.   NSAID use: none  Social hx: no etoh, smokes 1 pack every few days, vapes  Fam hx: maternal grandfather had colon cancer in his 50s  Last Colonoscopy:never  Last Endoscopy: never   Recommendations:    Past Medical History:  Diagnosis Date   Current smoker    Hyperlipidemia     Past Surgical History:  Procedure Laterality Date   ABDOMINAL HYSTERECTOMY      Current Outpatient Medications  Medication Sig Dispense Refill   aspirin EC 81 MG tablet Take 81 mg by mouth daily. Swallow whole.     rosuvastatin (CRESTOR) 40 MG tablet Take 40 mg by mouth daily.     traZODone (DESYREL) 50 MG tablet Take 50 mg by mouth. Takes 2 at bedtime     No current facility-administered medications for this visit.    Allergies as of 01/23/2023   (No Active Allergies)    Family History  Problem Relation Age of Onset   COPD Mother    Cancer Father        lung, liver, brain primary unknown   Heart disease Brother 51       MI   Colon cancer Maternal Grandfather     Social History   Socioeconomic History   Marital status: Divorced    Spouse name: Not on file   Number of children: Not on file   Years of education: Not on file   Highest education  level: Not on file  Occupational History   Not on file  Tobacco Use   Smoking status: Every Day    Packs/day: 1    Types: Cigarettes    Passive exposure: Current   Smokeless tobacco: Never  Substance and Sexual Activity   Alcohol use: No   Drug use: No   Sexual activity: Yes    Comment: works in [employer], cares for her grandchildren who have ADD  Other Topics Concern   Not on file  Social History Narrative   Not on file   Social Determinants of Health   Financial Resource Strain: Not on file  Food Insecurity: Not on file  Transportation Needs: Not on file  Physical Activity: Not on file  Stress: Not on file  Social Connections: Not on file   Review of systems General: negative for malaise, night sweats, fever, chills, weight loss Neck: Negative for lumps, goiter, pain and significant neck swelling Resp: Negative for cough, wheezing, dyspnea at rest CV: Negative for chest pain, leg swelling, palpitations, orthopnea GI: denies melena, hematochezia, nausea, vomiting, diarrhea, constipation, dysphagia, odyonophagia, early satiety or unintentional weight loss.  MSK: Negative for joint pain or swelling, back pain, and muscle pain. Derm: Negative for itching or rash Psych: Denies depression, anxiety,   memory loss, confusion. No homicidal or suicidal ideation.  Heme: Negative for prolonged bleeding, bruising easily, and swollen nodes. Endocrine: Negative for cold or heat intolerance, polyuria, polydipsia and goiter. Neuro: negative for tremor, gait imbalance, syncope and seizures. The remainder of the review of systems is noncontributory.  Physical Exam: BP 137/78 (BP Location: Left Arm, Patient Position: Sitting, Cuff Size: Large)   Pulse 68   Temp 97.9 F (36.6 C) (Oral)   Ht 5' 3" (1.6 m)   Wt 140 lb (63.5 kg)   BMI 24.80 kg/m  General:   Alert and oriented. No distress noted. Pleasant and cooperative.  Head:  Normocephalic and atraumatic. Eyes:  Conjuctiva clear  without scleral icterus. Mouth:  Oral mucosa pink and moist. Good dentition. No lesions. Heart: Normal rate and rhythm, s1 and s2 heart sounds present.  Lungs: Clear lung sounds in all lobes. Respirations equal and unlabored. Abdomen:  +BS, soft, non-tender and non-distended. No rebound or guarding. No HSM or masses noted. Derm: No palmar erythema or jaundice Msk:  Symmetrical without gross deformities. Normal posture. Extremities:  Without edema. Neurologic:  Alert and  oriented x4 Psych:  Alert and cooperative. Normal mood and affect.  Invalid input(s): "6 MONTHS"   ASSESSMENT: Michelle Clayton is a 57 y.o. female presenting today as a new patient for positive cologuard  Positive cologuard in march 2024, no previous colonoscopy.  Discussed with the patient that Cologuard is intended for the qualitative detection of colorectal neoplasia associated DNA markers and for the presence of occult hemoglobin in human stool. A positive result may indicate the presence of colorectal cancer (CRC) or pre cancerous polyps, and should be followed by colonoscopy. A negative Cologuard test result does not guarantee absence of cancer or pre cancerous polyp. False positives and false negatives do occur.recommend proceeding with Colonoscopy at this time. Indications, risks and benefits of procedure discussed in detail with patient. Patient verbalized understanding and is in agreement to proceed with Colonoscopy    PLAN:  Schedule Colonoscopy-ASA II (2 day prep)  All questions were answered, patient verbalized understanding and is in agreement with plan as outlined above.   Follow up TBD   Vang Kraeger L. Tate Jerkins, MSN, APRN, AGNP-C Adult-Gerontology Nurse Practitioner [facility]  I have reviewed the note and agree with the APP's assessment as described in this progress note  Daniel Castaneda, MD Gastroenterology and Hepatology [facility]  

## 2023-01-23 NOTE — Progress Notes (Signed)
Referring Provider: Joana Reamer, DO Primary Care Physician:  Joana Reamer, DO Primary GI Physician: new   Chief Complaint  Patient presents with   positive cologuard    Referred for Positive cologuard test. Reports she has BM about once a week.    HPI:   Michelle Clayton is a 57 y.o. female with past medical history of HLD   Patient presenting today as a new patient for positive cologuard   She reports she did cologuard that was positive. No previous colonoscopy. Has BM once per week which is baseline, does not feel the need to strain. No red flag symptoms. Patient denies melena, hematochezia, nausea, vomiting, diarrhea, constipation, dysphagia, odyonophagia, early satiety or weight loss. She has no GI complaints today.   NSAID use: none  Social hx: no etoh, smokes 1 pack every few days, vapes  Fam hx: maternal grandfather had colon cancer in his 5s  Last Colonoscopy:never  Last Endoscopy: never   Recommendations:    Past Medical History:  Diagnosis Date   Current smoker    Hyperlipidemia     Past Surgical History:  Procedure Laterality Date   ABDOMINAL HYSTERECTOMY      Current Outpatient Medications  Medication Sig Dispense Refill   aspirin EC 81 MG tablet Take 81 mg by mouth daily. Swallow whole.     rosuvastatin (CRESTOR) 40 MG tablet Take 40 mg by mouth daily.     traZODone (DESYREL) 50 MG tablet Take 50 mg by mouth. Takes 2 at bedtime     No current facility-administered medications for this visit.    Allergies as of 01/23/2023   (No Active Allergies)    Family History  Problem Relation Age of Onset   COPD Mother    Cancer Father        lung, liver, brain primary unknown   Heart disease Brother 48       MI   Colon cancer Maternal Grandfather     Social History   Socioeconomic History   Marital status: Divorced    Spouse name: Not on file   Number of children: Not on file   Years of education: Not on file   Highest education  level: Not on file  Occupational History   Not on file  Tobacco Use   Smoking status: Every Day    Packs/day: 1    Types: Cigarettes    Passive exposure: Current   Smokeless tobacco: Never  Substance and Sexual Activity   Alcohol use: No   Drug use: No   Sexual activity: Yes    Comment: works in [employer] care, cares for her grandchildren who have ADD  Other Topics Concern   Not on file  Social History Narrative   Not on file   Social Determinants of Health   Financial Resource Strain: Not on file  Food Insecurity: Not on file  Transportation Needs: Not on file  Physical Activity: Not on file  Stress: Not on file  Social Connections: Not on file   Review of systems General: negative for malaise, night sweats, fever, chills, weight loss Neck: Negative for lumps, goiter, pain and significant neck swelling Resp: Negative for cough, wheezing, dyspnea at rest CV: Negative for chest pain, leg swelling, palpitations, orthopnea GI: denies melena, hematochezia, nausea, vomiting, diarrhea, constipation, dysphagia, odyonophagia, early satiety or unintentional weight loss.  MSK: Negative for joint pain or swelling, back pain, and muscle pain. Derm: Negative for itching or rash Psych: Denies depression, anxiety,  memory loss, confusion. No homicidal or suicidal ideation.  Heme: Negative for prolonged bleeding, bruising easily, and swollen nodes. Endocrine: Negative for cold or heat intolerance, polyuria, polydipsia and goiter. Neuro: negative for tremor, gait imbalance, syncope and seizures. The remainder of the review of systems is noncontributory.  Physical Exam: BP 137/78 (BP Location: Left Arm, Patient Position: Sitting, Cuff Size: Large)   Pulse 68   Temp 97.9 F (36.6 C) (Oral)   Ht 5\' 3"  (1.6 m)   Wt 140 lb (63.5 kg)   BMI 24.80 kg/m  General:   Alert and oriented. No distress noted. Pleasant and cooperative.  Head:  Normocephalic and atraumatic. Eyes:  Conjuctiva clear  without scleral icterus. Mouth:  Oral mucosa pink and moist. Good dentition. No lesions. Heart: Normal rate and rhythm, s1 and s2 heart sounds present.  Lungs: Clear lung sounds in all lobes. Respirations equal and unlabored. Abdomen:  +BS, soft, non-tender and non-distended. No rebound or guarding. No HSM or masses noted. Derm: No palmar erythema or jaundice Msk:  Symmetrical without gross deformities. Normal posture. Extremities:  Without edema. Neurologic:  Alert and  oriented x4 Psych:  Alert and cooperative. Normal mood and affect.  Invalid input(s): "6 MONTHS"   ASSESSMENT: Michelle Clayton is a 57 y.o. female presenting today as a new patient for positive cologuard  Positive cologuard in march 2024, no previous colonoscopy.  Discussed with the patient that Cologuard is intended for the qualitative detection of colorectal neoplasia associated DNA markers and for the presence of occult hemoglobin in human stool. A positive result may indicate the presence of colorectal cancer (CRC) or pre cancerous polyps, and should be followed by colonoscopy. A negative Cologuard test result does not guarantee absence of cancer or pre cancerous polyp. False positives and false negatives do occur.recommend proceeding with Colonoscopy at this time. Indications, risks and benefits of procedure discussed in detail with patient. Patient verbalized understanding and is in agreement to proceed with Colonoscopy    PLAN:  Schedule Colonoscopy-ASA II (2 day prep)  All questions were answered, patient verbalized understanding and is in agreement with plan as outlined above.   Follow up TBD   Detrich Rakestraw L. Jeanmarie Hubert, MSN, APRN, AGNP-C Adult-Gerontology Nurse Practitioner [facility]  I have reviewed the note and agree with the APP's assessment as described in this progress note  Katrinka Blazing, MD Gastroenterology and Hepatology [facility]

## 2023-01-24 ENCOUNTER — Other Ambulatory Visit (INDEPENDENT_AMBULATORY_CARE_PROVIDER_SITE_OTHER): Payer: Self-pay | Admitting: Gastroenterology

## 2023-01-24 ENCOUNTER — Telehealth (INDEPENDENT_AMBULATORY_CARE_PROVIDER_SITE_OTHER): Payer: Self-pay | Admitting: Gastroenterology

## 2023-01-24 ENCOUNTER — Encounter (INDEPENDENT_AMBULATORY_CARE_PROVIDER_SITE_OTHER): Payer: Self-pay

## 2023-01-24 MED ORDER — PEG 3350-KCL-NA BICARB-NACL 420 G PO SOLR: 4000.0000 mL | Freq: Once | ORAL | 0 refills | Status: AC

## 2023-01-24 NOTE — Telephone Encounter (Signed)
Pt returned call and TCS scheduled. Prep sent to pharmacy and instructions sent via my chart.

## 2023-01-24 NOTE — Telephone Encounter (Signed)
[facility] to schedule TCS (ASA 2) (2 day prep)

## 2023-02-01 ENCOUNTER — Ambulatory Visit (HOSPITAL_COMMUNITY): Payer: Medicaid Other

## 2023-02-01 ENCOUNTER — Encounter (HOSPITAL_COMMUNITY): Payer: Self-pay

## 2023-02-15 ENCOUNTER — Telehealth (INDEPENDENT_AMBULATORY_CARE_PROVIDER_SITE_OTHER): Payer: Self-pay | Admitting: Gastroenterology

## 2023-02-15 ENCOUNTER — Ambulatory Visit (HOSPITAL_COMMUNITY)
Admission: RE | Admit: 2023-02-15 | Discharge: 2023-02-15 | Disposition: A | Discharge status: Home / Self Care | Payer: Medicaid Other | Source: Ambulatory Visit | Attending: Gastroenterology | Admitting: Gastroenterology

## 2023-02-15 ENCOUNTER — Encounter (HOSPITAL_COMMUNITY)
Admission: RE | Disposition: A | Discharge status: Home / Self Care | Payer: Self-pay | Source: Ambulatory Visit | Attending: Gastroenterology

## 2023-02-15 ENCOUNTER — Encounter (HOSPITAL_COMMUNITY): Payer: Self-pay | Admitting: Anesthesiology

## 2023-02-15 SURGERY — COLONOSCOPY WITH PROPOFOL
Anesthesia: Monitor Anesthesia Care

## 2023-02-15 NOTE — Progress Notes (Signed)
During patients pre op questioning, patient informed nurse that she would be riding [employer] transportation home. When asked If she had a supervising adult to ride the transportation home with her, she replied that she did not have someone at the hospital but someone was at home. She did not have a responsible adult that could come to be with her for her ride home. After consulting with Anesthesia the patients procedure was cancelled and would be need to be rescheduled due to lack of a responsible party being present.

## 2023-02-15 NOTE — Interval H&P Note (Signed)
History and Physical Interval Note:  02/15/2023 11:26 AM  Michelle Clayton  has presented today for surgery, with the diagnosis of positive cologuard.  The various methods of treatment have been discussed with the patient and family. After consideration of risks, benefits and other options for treatment, the patient has consented to  Procedure(s) with comments: COLONOSCOPY WITH PROPOFOL (N/A) - 1:15pm;asa 2 as a surgical intervention.  The patient's history has been reviewed, patient examined, no change in status, stable for surgery.  I have reviewed the patient's chart and labs.  Questions were answered to the patient's satisfaction.     Katrinka Blazing Mayorga

## 2023-02-15 NOTE — Telephone Encounter (Signed)
Pt sent my chart message:     I have to reschedule I didn't have anyone with me to ride home with me so they wouldn't do it  Contacted patient and pt rescheduled for 02/22/23 at 7:30am. Pt requesting the pills; informed pt insurance does not like to pay for pills but I could work on the PA if it came.   Advised pt of Clenpiq. Pt ok with Clenpiq. Will place sample up front along with instructions.

## 2023-02-22 ENCOUNTER — Encounter (INDEPENDENT_AMBULATORY_CARE_PROVIDER_SITE_OTHER): Payer: Self-pay | Admitting: *Deleted

## 2023-02-22 ENCOUNTER — Other Ambulatory Visit: Payer: Self-pay

## 2023-02-22 ENCOUNTER — Encounter (HOSPITAL_COMMUNITY): Payer: Self-pay | Admitting: Gastroenterology

## 2023-02-22 ENCOUNTER — Ambulatory Visit (HOSPITAL_COMMUNITY)
Admission: RE | Admit: 2023-02-22 | Discharge: 2023-02-22 | Disposition: A | Discharge status: Home / Self Care | Payer: Medicaid Other | Source: Ambulatory Visit | Attending: Gastroenterology | Admitting: Gastroenterology

## 2023-02-22 ENCOUNTER — Ambulatory Visit (HOSPITAL_BASED_OUTPATIENT_CLINIC_OR_DEPARTMENT_OTHER): Payer: Medicaid Other | Admitting: Anesthesiology

## 2023-02-22 ENCOUNTER — Ambulatory Visit (HOSPITAL_COMMUNITY): Payer: Medicaid Other | Admitting: Anesthesiology

## 2023-02-22 ENCOUNTER — Encounter (HOSPITAL_COMMUNITY)
Admission: RE | Disposition: A | Discharge status: Home / Self Care | Payer: Self-pay | Source: Ambulatory Visit | Attending: Gastroenterology

## 2023-02-22 DIAGNOSIS — F17210 Nicotine dependence, cigarettes, uncomplicated: Secondary | ICD-10-CM | POA: Insufficient documentation

## 2023-02-22 DIAGNOSIS — D126 Benign neoplasm of colon, unspecified: Secondary | ICD-10-CM

## 2023-02-22 DIAGNOSIS — D122 Benign neoplasm of ascending colon: Secondary | ICD-10-CM | POA: Insufficient documentation

## 2023-02-22 DIAGNOSIS — K621 Rectal polyp: Secondary | ICD-10-CM | POA: Diagnosis not present

## 2023-02-22 DIAGNOSIS — E785 Hyperlipidemia, unspecified: Secondary | ICD-10-CM | POA: Diagnosis not present

## 2023-02-22 DIAGNOSIS — Z1211 Encounter for screening for malignant neoplasm of colon: Secondary | ICD-10-CM | POA: Diagnosis present

## 2023-02-22 DIAGNOSIS — R195 Other fecal abnormalities: Secondary | ICD-10-CM | POA: Insufficient documentation

## 2023-02-22 DIAGNOSIS — D128 Benign neoplasm of rectum: Secondary | ICD-10-CM

## 2023-02-22 DIAGNOSIS — K635 Polyp of colon: Secondary | ICD-10-CM

## 2023-02-22 DIAGNOSIS — D123 Benign neoplasm of transverse colon: Secondary | ICD-10-CM | POA: Diagnosis not present

## 2023-02-22 DIAGNOSIS — Z79899 Other long term (current) drug therapy: Secondary | ICD-10-CM | POA: Diagnosis not present

## 2023-02-22 DIAGNOSIS — J449 Chronic obstructive pulmonary disease, unspecified: Secondary | ICD-10-CM | POA: Insufficient documentation

## 2023-02-22 DIAGNOSIS — Z800 Family history of malignant neoplasm of digestive organs: Secondary | ICD-10-CM | POA: Diagnosis not present

## 2023-02-22 HISTORY — PX: POLYPECTOMY: SHX5525

## 2023-02-22 HISTORY — PX: COLONOSCOPY WITH PROPOFOL: SHX5780

## 2023-02-22 HISTORY — DX: Chronic obstructive pulmonary disease, unspecified: J44.9

## 2023-02-22 LAB — HM COLONOSCOPY

## 2023-02-22 SURGERY — COLONOSCOPY WITH PROPOFOL
Anesthesia: General

## 2023-02-22 MED ORDER — PROPOFOL 500 MG/50ML IV EMUL
INTRAVENOUS | Status: DC | PRN
  Administered 2023-02-22: 150 ug/kg/min via INTRAVENOUS

## 2023-02-22 MED ORDER — LIDOCAINE HCL (CARDIAC) PF 100 MG/5ML IV SOSY
PREFILLED_SYRINGE | INTRAVENOUS | Status: DC | PRN
  Administered 2023-02-22: 50 mg via INTRAVENOUS

## 2023-02-22 MED ORDER — EPHEDRINE SULFATE-NACL 50-0.9 MG/10ML-% IV SOSY
PREFILLED_SYRINGE | INTRAVENOUS | Status: DC | PRN
  Administered 2023-02-22 (×3): 5 mg via INTRAVENOUS

## 2023-02-22 MED ORDER — LACTATED RINGERS IV SOLN
INTRAVENOUS | Status: DC
  Administered 2023-02-22: 1000 mL via INTRAVENOUS

## 2023-02-22 MED ORDER — EPHEDRINE 5 MG/ML INJ
INTRAVENOUS | Status: AC
  Filled 2023-02-22: qty 5

## 2023-02-22 MED ORDER — PROPOFOL 10 MG/ML IV BOLUS
INTRAVENOUS | Status: DC | PRN
  Administered 2023-02-22: 100 mg via INTRAVENOUS

## 2023-02-22 NOTE — Anesthesia Postprocedure Evaluation (Signed)
Anesthesia Post Note  Patient: [name]  Procedure(s) Performed: COLONOSCOPY WITH PROPOFOL POLYPECTOMY  Patient location during evaluation: Phase II Anesthesia Type: General Level of consciousness: awake and alert and oriented Pain management: pain level controlled Vital Signs Assessment: post-procedure vital signs reviewed and stable Respiratory status: spontaneous breathing, nonlabored ventilation and respiratory function stable Cardiovascular status: blood pressure returned to baseline and stable Postop Assessment: no apparent nausea or vomiting Anesthetic complications: no  No notable events documented.   Last Vitals:  Vitals:   02/22/23 0655 02/22/23 0808  BP: 128/68 (!) 92/58  Pulse: (!) 50 66  Resp: 18 14  Temp: (!) 36.1 C 36.4 C  SpO2: 96% 96%    Last Pain:  Vitals:   02/22/23 0808  TempSrc: Oral  PainSc: 0-No pain                 Daisa Stennis C Karimah Winquist

## 2023-02-22 NOTE — Anesthesia Preprocedure Evaluation (Addendum)
Anesthesia Evaluation  Patient identified by MRN, date of birth, ID band Patient awake    Reviewed: Allergy & Precautions, H&P , NPO status , Patient's Chart, lab work & pertinent test results  Airway Mallampati: II  TM Distance: >3 FB Neck ROM: Full    Dental  (+) Missing, Dental Advisory Given   Pulmonary COPD,  COPD inhaler, Current Smoker and Patient abstained from smoking.   Pulmonary exam normal breath sounds clear to auscultation       Cardiovascular negative cardio ROS Normal cardiovascular exam Rhythm:Regular Rate:Normal     Neuro/Psych negative neurological ROS  negative psych ROS   GI/Hepatic negative GI ROS, Neg liver ROS,,,  Endo/Other  negative endocrine ROS    Renal/GU negative Renal ROS  negative genitourinary   Musculoskeletal negative musculoskeletal ROS (+)    Abdominal   Peds negative pediatric ROS (+)  Hematology negative hematology ROS (+)   Anesthesia Other Findings   Reproductive/Obstetrics negative OB ROS                             Anesthesia Physical Anesthesia Plan  ASA: 2  Anesthesia Plan: General   Post-op Pain Management: Minimal or no pain anticipated   Induction: Intravenous  PONV Risk Score and Plan: 1 and Treatment may vary due to age or medical condition  Airway Management Planned: Nasal Cannula and Natural Airway  Additional Equipment:   Intra-op Plan:   Post-operative Plan:   Informed Consent: I have reviewed the patients History and Physical, chart, labs and discussed the procedure including the risks, benefits and alternatives for the proposed anesthesia with the patient or authorized representative who has indicated his/her understanding and acceptance.     Dental advisory given  Plan Discussed with: CRNA and Surgeon  Anesthesia Plan Comments:        Anesthesia Quick Evaluation

## 2023-02-22 NOTE — Op Note (Signed)
[facility] Patient Name: Michelle Clayton Procedure Date: 02/22/2023 6:54 AM MRN: [id number] Date of Birth: 02/08/1966 Attending MD: Katrinka Blazing , , [id number] CSN: [id number] Age: 57 Admit Type: Outpatient Procedure:                Colonoscopy Indications:              Positive Cologuard test Providers:                Katrinka Blazing, Angelica Ran, Pandora Leiter,                            Technician Referring MD:              Medicines:                Monitored Anesthesia Care Complications:            No immediate complications. Estimated Blood Loss:     Estimated blood loss: none. Procedure:                Pre-Anesthesia Assessment:                           - Prior to the procedure, a History and Physical                            was performed, and patient medications, allergies                            and sensitivities were reviewed. The patient's                            tolerance of previous anesthesia was reviewed.                           - The risks and benefits of the procedure and the                            sedation options and risks were discussed with the                            patient. All questions were answered and informed                            consent was obtained.                           - ASA Grade Assessment: II - A patient with mild                            systemic disease.                           After obtaining informed consent, the colonoscope                            was passed under direct vision. Throughout the  procedure, the patient's blood pressure, pulse, and                            oxygen saturations were monitored continuously. The                            [id number]-[id number] ([id number]) scope was introduced through                            the anus and advanced to the the cecum, identified                            by appendiceal orifice and ileocecal valve. The                             colonoscopy was performed without difficulty. The                            patient tolerated the procedure well. The quality                            of the bowel preparation was excellent. Scope In: 7:41:03 AM Scope Out: 8:05:11 AM Scope Withdrawal Time: 0 hours 17 minutes 21 seconds  Total Procedure Duration: 0 hours 24 minutes 8 seconds  Findings:      The perianal and digital rectal examinations were normal.      Five sessile polyps were found in the transverse colon and ascending       colon. The polyps were 3 to 10 mm in size. These polyps were removed       with a cold snare. Resection and retrieval were complete.      A 10 mm polyp was found in the rectum. The polyp was sessile. The polyp       was removed with a cold snare. Resection and retrieval were complete.      The retroflexed view of the distal rectum and anal verge was normal and       showed no anal or rectal abnormalities. Impression:               - Five 3 to 10 mm polyps in the transverse colon                            and in the ascending colon, removed with a cold                            snare. Resected and retrieved.                           - One 10 mm polyp in the rectum, removed with a                            cold snare. Resected and retrieved.                           - The distal rectum and anal verge are  normal on                            retroflexion view. Moderate Sedation:      Per Anesthesia Care Recommendation:           - Discharge patient to home (ambulatory).                           - Resume previous diet.                           - Await pathology results.                           - Repeat colonoscopy in 3 years for surveillance. Procedure Code(s):        --- Professional ---                           [phone], Colonoscopy, flexible; with removal of                            tumor(s), polyp(s), or other lesion(s) by snare                            technique Diagnosis  Code(s):        --- Professional ---                           D12.3, Benign neoplasm of transverse colon (hepatic                            flexure or splenic flexure)                           D12.2, Benign neoplasm of ascending colon                           D12.8, Benign neoplasm of rectum                           R19.5, Other fecal abnormalities CPT copyright 2022 American Medical Association. All rights reserved. The codes documented in this report are preliminary and upon coder review may  be revised to meet current compliance requirements. Katrinka Blazing, MD Katrinka Blazing,  02/22/2023 8:13:12 AM This report has been signed electronically. Number of Addenda: 0

## 2023-02-22 NOTE — Interval H&P Note (Signed)
History and Physical Interval Note:  02/22/2023 7:28 AM  Michelle Clayton  has presented today for surgery, with the diagnosis of positive cologuard.  The various methods of treatment have been discussed with the patient and family. After consideration of risks, benefits and other options for treatment, the patient has consented to  Procedure(s) with comments: COLONOSCOPY WITH PROPOFOL (N/A) - 7:30AM;ASA 2 as a surgical intervention.  The patient's history has been reviewed, patient examined, no change in status, stable for surgery.  I have reviewed the patient's chart and labs.  Questions were answered to the patient's satisfaction.     Katrinka Blazing Mayorga

## 2023-02-22 NOTE — Discharge Instructions (Signed)
You are being discharged to home.  Resume your previous diet.  We are waiting for your pathology results.  Your physician has recommended a repeat colonoscopy in three years for surveillance.  

## 2023-02-22 NOTE — Anesthesia Procedure Notes (Signed)
Date/Time: 02/22/2023 7:40 AM  Performed by: Julian Reil, CRNAPre-anesthesia Checklist: Patient identified, Emergency Drugs available, Suction available and Patient being monitored Patient Re-evaluated:Patient Re-evaluated prior to induction Oxygen Delivery Method: Nasal cannula Induction Type: IV induction Placement Confirmation: positive ETCO2

## 2023-02-22 NOTE — Transfer of Care (Signed)
Immediate Anesthesia Transfer of Care Note  Patient: Michelle Clayton  Procedure(s) Performed: COLONOSCOPY WITH PROPOFOL POLYPECTOMY  Patient Location: Endoscopy Unit  Anesthesia Type:General  Level of Consciousness: awake  Airway & Oxygen Therapy: Patient Spontanous Breathing  Post-op Assessment: Report given to RN and Post -op Vital signs reviewed and stable  Post vital signs: Reviewed and stable  Last Vitals:  Vitals Value Taken Time  BP    Temp    Pulse    Resp    SpO2      Last Pain:  Vitals:   02/22/23 0737  TempSrc:   PainSc: 0-No pain      Patients Stated Pain Goal: 5 (02/22/23 0655)  Complications: No notable events documented.

## 2023-02-23 ENCOUNTER — Encounter (INDEPENDENT_AMBULATORY_CARE_PROVIDER_SITE_OTHER): Payer: Self-pay | Admitting: *Deleted

## 2023-02-23 LAB — SURGICAL PATHOLOGY

## 2023-02-28 ENCOUNTER — Encounter (HOSPITAL_COMMUNITY): Payer: Self-pay | Admitting: Gastroenterology

## 2023-03-14 ENCOUNTER — Ambulatory Visit: Payer: Medicaid Other | Admitting: Internal Medicine

## 2023-03-20 ENCOUNTER — Ambulatory Visit: Payer: Medicaid Other | Admitting: Internal Medicine

## 2023-03-29 ENCOUNTER — Encounter (INDEPENDENT_AMBULATORY_CARE_PROVIDER_SITE_OTHER): Payer: Self-pay

## 2023-05-22 ENCOUNTER — Ambulatory Visit: Payer: Medicaid Other | Admitting: Internal Medicine

## 2023-11-21 ENCOUNTER — Ambulatory Visit: Payer: Medicaid Other | Admitting: Internal Medicine

## 2024-05-29 ENCOUNTER — Encounter (INDEPENDENT_AMBULATORY_CARE_PROVIDER_SITE_OTHER): Payer: Self-pay | Admitting: Gastroenterology
